# Patient Record
Sex: Female | Born: 1955 | Race: White | Hispanic: No | Smoking: Never smoker
Health system: Southern US, Community
[De-identification: ages and names within clinical notes are randomized; demographics above are authoritative.]

## PROBLEM LIST (undated history)

## (undated) DIAGNOSIS — F419 Anxiety disorder, unspecified: Secondary | ICD-10-CM

## (undated) HISTORY — PX: EYE SURGERY: SHX253

## (undated) HISTORY — PX: FOOT SURGERY: SHX648

## (undated) HISTORY — DX: Anxiety disorder, unspecified: F41.9

---
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
## 1988-05-04 HISTORY — PX: ECTOPIC PREGNANCY SURGERY: SHX613

## 1998-07-04 ENCOUNTER — Other Ambulatory Visit: Admission: RE | Admit: 1998-07-04 | Discharge: 1998-07-04 | Payer: Self-pay | Admitting: Gynecology

## 1998-07-26 ENCOUNTER — Other Ambulatory Visit: Admission: RE | Admit: 1998-07-26 | Discharge: 1998-07-26 | Payer: Self-pay | Admitting: Gynecology

## 1998-10-21 ENCOUNTER — Other Ambulatory Visit: Admission: RE | Admit: 1998-10-21 | Discharge: 1998-10-21 | Payer: Self-pay | Admitting: Gynecology

## 1999-06-16 ENCOUNTER — Other Ambulatory Visit: Admission: RE | Admit: 1999-06-16 | Discharge: 1999-06-16 | Payer: Self-pay | Admitting: Gynecology

## 2000-04-19 ENCOUNTER — Other Ambulatory Visit: Admission: RE | Admit: 2000-04-19 | Discharge: 2000-04-19 | Payer: Self-pay | Admitting: Gynecology

## 2000-12-21 ENCOUNTER — Other Ambulatory Visit: Admission: RE | Admit: 2000-12-21 | Discharge: 2000-12-21 | Payer: Self-pay | Admitting: Gynecology

## 2002-01-25 ENCOUNTER — Other Ambulatory Visit: Admission: RE | Admit: 2002-01-25 | Discharge: 2002-01-25 | Payer: Self-pay | Admitting: Gynecology

## 2003-03-21 ENCOUNTER — Other Ambulatory Visit: Admission: RE | Admit: 2003-03-21 | Discharge: 2003-03-21 | Payer: Self-pay | Admitting: Gynecology

## 2004-04-08 ENCOUNTER — Other Ambulatory Visit: Admission: RE | Admit: 2004-04-08 | Discharge: 2004-04-08 | Payer: Self-pay | Admitting: Gynecology

## 2005-03-17 ENCOUNTER — Encounter: Admission: RE | Admit: 2005-03-17 | Discharge: 2005-03-17 | Payer: Self-pay | Admitting: Otolaryngology

## 2005-05-12 ENCOUNTER — Other Ambulatory Visit: Admission: RE | Admit: 2005-05-12 | Discharge: 2005-05-12 | Payer: Self-pay | Admitting: Gynecology

## 2006-06-07 ENCOUNTER — Other Ambulatory Visit: Admission: RE | Admit: 2006-06-07 | Discharge: 2006-06-07 | Payer: Self-pay | Admitting: Gynecology

## 2011-11-24 ENCOUNTER — Other Ambulatory Visit: Payer: Self-pay | Admitting: Orthopedic Surgery

## 2011-11-26 NOTE — H&P (Signed)
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 She is right-hand dominant.  She presents for evaluation of chronic discomfort in her right hand and a syndrome of awakening in the morning with a clenched fish. She has had active locking of her right long trigger finger and a sense of numbness in her thumb, index, long and ring fingers virtually every morning upon awakening.  Her husband has been a patient of our practice and accompanies her during her consult today.  PAST MEDICAL HISTORY:   Her past medical history is reviewed in detail. She is 5'5" tall and weighs 190 pounds.  She describes her pain as constant, moderately severe, dull, aching and burning in quality, associated with swelling and numbness.  It is gradually getting worse.  She does have sleep impairment.  She has been using ibuprofen for pain.    ALLERGIES:   None. MEDICATIONS:   Elestrin gel applied b.i.d. SURGICAL HISTORY:    Cyst excision from sinus in 2006, Morton's neuroma resection in 2011 left foot. SOCIAL HISTORY:    She is married, she is a nonsmoker, she does not drink alcoholic beverages. FAMILY MEDICAL HISTORY:  Detailed and positive for diabetes affecting her father.  REVIEW OF SYSTEMS:   Corrective lenses.  PHYSICAL EXAMINATION:     She is a well appearing 56 year-old woman.  Inspection of her hands reveals no significant stigmata of osteoarthritis.  She is noted to have full range of motion of her fingers in flexion/extension, but actively locks her right long finger in flexion. She is tender on palpation over the A-1 pulley.  She has positive wrist flexion test at one minute on the right, negative on the left.  She does not show signs of ulnar nerve entrapment.  Her pulse and cap refill are intact.  Motor and sensory examination intact at rest.    RADIOGRAPHS:   Plain x-rays of her hands, AP, lateral and a tangential Karen Singh view of her right thumb demonstrate normal findings for her  age.   Due to her persistent numbness electrodiagnostic studies were completed by Dr. Johna Roles.  These revealed no evidence of a significant right or left median neuropathy.    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    DISCUSSION:   Ms. Hyun appears to have tenosynovitis and stenosing tenosynovitis of her right long finger.  Her symptom pattern is consistent with early carpal tunnel syndrome.  PLAN:  I have advised her to night splint and have provided her with a properly sized splint.    PROCEDURE:   After informed consent and alcohol/Betadine prep she is injected with Depo Medrol and Lidocaine into her right long finger flexor sheath with good sheath distention.  We had a lengthy discussion regarding her symptom pattern. The patient returned 2 times over the next 8 months for additional treatment. She eventually underwent 1 more injection. Despite long term conservative treatment she has persistent symptoms of STS of the right long finger. We feel that she would be best served by undergoing release of the A-1 pulley of the right long finger.The procedure, risks,benefits and post-op course were discussed with the patient at length and they were in agreement with the plan.   Jonni Sanger PA-C  H&P documentation: 11/27/2011  -History and Physical Reviewed  -Patient has been re-examined  -No change in the plan of care  Karen Singh  Christena Flake, MD

## 2011-11-27 ENCOUNTER — Encounter (HOSPITAL_BASED_OUTPATIENT_CLINIC_OR_DEPARTMENT_OTHER)
Admission: RE | Disposition: A | Discharge status: Home / Self Care | Payer: Self-pay | Source: Ambulatory Visit | Attending: Orthopedic Surgery

## 2011-11-27 ENCOUNTER — Encounter (HOSPITAL_BASED_OUTPATIENT_CLINIC_OR_DEPARTMENT_OTHER): Payer: Self-pay | Admitting: *Deleted

## 2011-11-27 ENCOUNTER — Ambulatory Visit (HOSPITAL_BASED_OUTPATIENT_CLINIC_OR_DEPARTMENT_OTHER)
Admission: RE | Admit: 2011-11-27 | Discharge: 2011-11-27 | Disposition: A | Discharge status: Home / Self Care | Payer: 59 | Source: Ambulatory Visit | Attending: Orthopedic Surgery | Admitting: Orthopedic Surgery

## 2011-11-27 DIAGNOSIS — M65849 Other synovitis and tenosynovitis, unspecified hand: Secondary | ICD-10-CM | POA: Insufficient documentation

## 2011-11-27 DIAGNOSIS — M6530 Trigger finger, unspecified finger: Secondary | ICD-10-CM | POA: Insufficient documentation

## 2011-11-27 DIAGNOSIS — M65839 Other synovitis and tenosynovitis, unspecified forearm: Secondary | ICD-10-CM | POA: Insufficient documentation

## 2011-11-27 HISTORY — PX: TRIGGER FINGER RELEASE: SHX641

## 2011-11-27 SURGERY — MINOR RELEASE TRIGGER FINGER/A-1 PULLEY
Anesthesia: LOCAL | Site: Hand | Laterality: Right | Wound class: Clean

## 2011-11-27 MED ORDER — CHLORHEXIDINE GLUCONATE 4 % EX LIQD: 60.0000 mL | Freq: Once | CUTANEOUS | Status: DC

## 2011-11-27 MED ORDER — TRAMADOL HCL 50 MG PO TABS: ORAL_TABLET | ORAL | Status: AC

## 2011-11-27 MED ORDER — LIDOCAINE HCL 2 % IJ SOLN
INTRAMUSCULAR | Status: DC | PRN
  Administered 2011-11-27: 3 mL

## 2011-11-27 SURGICAL SUPPLY — 37 items
BANDAGE ADHESIVE 1X3 (GAUZE/BANDAGES/DRESSINGS) IMPLANT
BLADE SURG 15 STRL LF DISP TIS (BLADE) ×1 IMPLANT
BLADE SURG 15 STRL SS (BLADE) ×2
BNDG CMPR 9X4 STRL LF SNTH (GAUZE/BANDAGES/DRESSINGS) ×1
BNDG CMPR MD 5X2 ELC HKLP STRL (GAUZE/BANDAGES/DRESSINGS) ×1
BNDG ELASTIC 2 VLCR STRL LF (GAUZE/BANDAGES/DRESSINGS) ×2 IMPLANT
BNDG ESMARK 4X9 LF (GAUZE/BANDAGES/DRESSINGS) ×1 IMPLANT
BRUSH SCRUB EZ PLAIN DRY (MISCELLANEOUS) ×2 IMPLANT
CLOTH BEACON ORANGE TIMEOUT ST (SAFETY) ×2 IMPLANT
CORDS BIPOLAR (ELECTRODE) IMPLANT
COVER MAYO STAND STRL (DRAPES) ×2 IMPLANT
COVER TABLE BACK 60X90 (DRAPES) IMPLANT
CUFF TOURNIQUET SINGLE 18IN (TOURNIQUET CUFF) ×1 IMPLANT
DECANTER SPIKE VIAL GLASS SM (MISCELLANEOUS) IMPLANT
DRAPE SURG 17X23 STRL (DRAPES) ×2 IMPLANT
GAUZE SPONGE 4X4 12PLY STRL LF (GAUZE/BANDAGES/DRESSINGS) ×4 IMPLANT
GLOVE BIO SURGEON STRL SZ7 (GLOVE) ×1 IMPLANT
GLOVE BIOGEL M STRL SZ7.5 (GLOVE) ×2 IMPLANT
GLOVE EXAM NITRILE LRG STRL (GLOVE) ×1 IMPLANT
GLOVE ORTHO TXT STRL SZ7.5 (GLOVE) ×2 IMPLANT
GOWN BRE IMP PREV XXLGXLNG (GOWN DISPOSABLE) ×2 IMPLANT
GOWN PREVENTION PLUS XLARGE (GOWN DISPOSABLE) ×2 IMPLANT
NEEDLE 27GAX1X1/2 (NEEDLE) ×2 IMPLANT
PACK BASIN DAY SURGERY FS (CUSTOM PROCEDURE TRAY) IMPLANT
PADDING CAST ABS 4INX4YD NS (CAST SUPPLIES) ×1
PADDING CAST ABS COTTON 4X4 ST (CAST SUPPLIES) ×1 IMPLANT
SPONGE GAUZE 4X4 12PLY (GAUZE/BANDAGES/DRESSINGS) ×1 IMPLANT
STOCKINETTE 4X48 STRL (DRAPES) ×2 IMPLANT
STRIP CLOSURE SKIN 1/2X4 (GAUZE/BANDAGES/DRESSINGS) ×2 IMPLANT
SUT PROLENE 3 0 PS 2 (SUTURE) ×2 IMPLANT
SUT PROLENE 4 0 P 3 18 (SUTURE) IMPLANT
SYR 3ML 23GX1 SAFETY (SYRINGE) IMPLANT
SYR CONTROL 10ML LL (SYRINGE) ×2 IMPLANT
TOWEL OR 17X24 6PK STRL BLUE (TOWEL DISPOSABLE) ×4 IMPLANT
TRAY DSU PREP LF (CUSTOM PROCEDURE TRAY) ×2 IMPLANT
UNDERPAD 30X30 INCONTINENT (UNDERPADS AND DIAPERS) ×2 IMPLANT
WATER STERILE IRR 1000ML POUR (IV SOLUTION) ×1 IMPLANT

## 2011-11-27 NOTE — Brief Op Note (Signed)
11/27/2011  8:15 AM  PATIENT:  Leighton Parody  56 y.o. female  PRE-OPERATIVE DIAGNOSIS:  right long finger chronic triggering  POST-OPERATIVE DIAGNOSIS:  Right long trigger fiinger  PROCEDURE:  Procedure(s) (LRB): MINOR RELEASE TRIGGER FINGER/A-1 PULLEY (Right) long finger  SURGEON:  Surgeon(s) and Role:    * Wyn Forster., MD - Primary  PHYSICIAN ASSISTANT:   ASSISTANTS: Mallory Shirk.A-C   ANESTHESIA:   local  EBL:     BLOOD ADMINISTERED:none  DRAINS: none   LOCAL MEDICATIONS USED:  XYLOCAINE   SPECIMEN:  No Specimen  DISPOSITION OF SPECIMEN:  N/A  COUNTS:  YES  TOURNIQUET:   Total Tourniquet Time Documented: Upper Arm (Right) - 6 minutes  DICTATION: .Other Dictation: Dictation Number [id number]  PLAN OF CARE: Discharge to home after PACU  PATIENT DISPOSITION:  PACU - hemodynamically stable.   }

## 2011-11-30 ENCOUNTER — Encounter (HOSPITAL_BASED_OUTPATIENT_CLINIC_OR_DEPARTMENT_OTHER): Payer: Self-pay | Admitting: Orthopedic Surgery

## 2011-11-30 ENCOUNTER — Encounter (HOSPITAL_BASED_OUTPATIENT_CLINIC_OR_DEPARTMENT_OTHER): Payer: Self-pay

## 2011-11-30 NOTE — Op Note (Signed)
[name], [name]                  ACCOUNT NO.:  [id number]  MEDICAL RECORD NO.:  [id number]  LOCATION:                               FACILITY:  [facility]  PHYSICIAN:  Katy Fitch. Zacharey Jensen, M.D.      DATE OF BIRTH:  DATE OF PROCEDURE:  11/27/2011 DATE OF DISCHARGE:  11/27/2011                              OPERATIVE REPORT   PREOPERATIVE DIAGNOSIS:  Chronic stenosing tenosynovitis of right long finger with 10-degree flexion contracture of proximal interphalangeal joint.  POSTOPERATIVE DIAGNOSIS:  Chronic stenosing tenosynovitis of right long finger with 10-degree flexion contracture of proximal interphalangeal joint.  OPERATION:  Release of right long finger A1 pulley under local anesthesia.  OPERATING SURGEON:  Katy Fitch. Seydou Hearns, M.D.  ASSISTANT:  Marveen Reeks. Dasnoit, PA-C  ANESTHESIA:  2% lidocaine palmar block and flexor sheath block, right long finger.  This was performed as a minor operating room procedure.  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 Gennaro Lizotte Bellow at the urgent medical care center for evaluation and management of a locking trigger finger.  She has had triggering for months.  She had 10-degree flexion contracture of PIP joint and swelling.  She appeared to be developing some early Dupuytren's palmar fibromatosis as well.  Due to her failure to respond to nonoperative measures, she is brought to the operating room at this time for release of her right long finger A1 pulley.  PROCEDURE:  Karen Singh was interviewed in the holding area and her proper surgical site identified per protocol.  She was transferred to room 1 of the [facility] and placed in supine position upon the operating table.  There after informed consent and alcohol Betadine prep, 2% lidocaine was infiltrated in the path of the intended incision and long finger flexor sheath.  After waiting 10 minutes, she had satisfactory anesthesia in the  finger and palm.  The right hand and arm were prepped with Betadine soap and solution, sterilely draped.  A pneumatic tourniquet was applied to proximal right brachium.  Upon exsanguination of the right arm with Esmarch bandage, arterial tourniquet was inflated to 220 mmHg.  Procedure commenced with routine surgical time-out.  A short 1 cm incision was fashioned at the distal palmar crease.  Subcutaneous tissues were carefully divided revealing early Dupuytren's fibromatosis.  The pretendinous fibers to the long finger released with scissors as well as some of the septae. The A1 pulley was isolated and found to be moderately edematous.  The pulley was then split with scalpel scissors.  The tendon was delivered and found to be invested in a fibrotic cuff of tenosynovium.  This was removed with scissors and micro rongeur dissection.  Thereafter, Karen Singh demonstrated full active range of motion of her finger.  With the MP joint flexed 70 degrees, I could fully extend the PIP joint.  The wound was then repaired with intradermal 3-0 Prolene suture.  A compressive dressing was applied with Steri-Strips, sterile gauze, and Ace wrap.  Karen Singh was advised to begin range of motion exercises immediately. She may remove her large dressing in 3 days and begin using Band-Aids. We will  see her back for followup in our office in 1 week for suture removal.     Katy Fitch. Graylin Sperling, M.D.     RVS/MEDQ  D:  11/27/2011  T:  11/28/2011  Job:  [id number]  cc:   Jonita Albee, M.D.

## 2012-12-15 ENCOUNTER — Emergency Department (HOSPITAL_COMMUNITY)
Admission: EM | Admit: 2012-12-15 | Discharge: 2012-12-15 | Disposition: A | Discharge status: Other Acute Inpatient Hospital | Payer: 59 | Source: Home / Self Care

## 2012-12-15 ENCOUNTER — Encounter (HOSPITAL_COMMUNITY): Payer: Self-pay | Admitting: *Deleted

## 2012-12-15 DIAGNOSIS — R51 Headache: Secondary | ICD-10-CM

## 2012-12-15 DIAGNOSIS — Z79899 Other long term (current) drug therapy: Secondary | ICD-10-CM | POA: Insufficient documentation

## 2012-12-15 DIAGNOSIS — H53149 Visual discomfort, unspecified: Secondary | ICD-10-CM | POA: Insufficient documentation

## 2012-12-15 LAB — BASIC METABOLIC PANEL
BUN: 13 mg/dL (ref 6–23)
CO2: 24 mEq/L (ref 19–32)
Calcium: 9.6 mg/dL (ref 8.4–10.5)
Creatinine, Ser: 0.54 mg/dL (ref 0.50–1.10)
GFR calc Af Amer: >90 mL/min (ref >90)
GFR calc non Af Amer: >90 mL/min (ref >90)
Glucose, Bld: 114 mg/dL — ABNORMAL HIGH (ref 70–99)
Potassium: 3.9 mEq/L (ref 3.5–5.1)

## 2012-12-15 NOTE — ED Notes (Signed)
Headache since this am with n v no diarrhea

## 2012-12-15 NOTE — ED Notes (Signed)
Pt   Reports  Symptoms  Of  Headache        Dizzy     Vomiting  And  Photophobia   Which  Started  This am   Family   Member  Reports   thought  Processes  Seemed    A  Little  Slow  This  Am      At  This  Time  Pt is  Awake   Alert   Somewhat  Sheepish      No  Active    Vomiting  At this  Time

## 2012-12-15 NOTE — ED Provider Notes (Signed)
  CSN: [id number]     Arrival date & time 12/15/12  1746 History     First MD Initiated Contact with Patient 12/15/12 1826     Chief Complaint  Patient presents with  . Headache   (Consider location/radiation/quality/duration/timing/severity/associated sxs/prior Treatment) HPI  57 yo wf comes in with complaint of a severe headache that started earlier today.  States that this is the worst headache that she has ever had.  No hx of migraine or frequent headaches.  Admits photophobia, nausea, vomiting.  Unable to keep anything down.  At onset she felt like she was going to pass out.  Family member reported her seeming a "little slow" this morning.  No fever.    History reviewed. No pertinent past medical history. Past Surgical History  Procedure Laterality Date  . Trigger finger release  11/27/2011    Procedure: MINOR RELEASE TRIGGER FINGER/A-1 PULLEY;  Surgeon: Wyn Forster., MD;  Location: [facility];  Service: Orthopedics;  Laterality: Right;  right long   No family history on file. History  Substance Use Topics  . Smoking status: Not on file  . Smokeless tobacco: Not on file  . Alcohol Use: No   OB History   Grav Para Term Preterm Abortions TAB SAB Ect Mult Living                 Review of Systems  Constitutional: Positive for activity change. Negative for fever.  Eyes: Positive for photophobia. Negative for pain, discharge and itching.  Respiratory: Negative.   Cardiovascular: Negative.   Gastrointestinal: Positive for nausea and vomiting.  Genitourinary: Negative.   Musculoskeletal: Negative.   Skin: Negative.   Neurological: Positive for headaches. Negative for seizures, syncope, speech difficulty, weakness and numbness.  Psychiatric/Behavioral: Negative.     Allergies  Review of patient's allergies indicates no known allergies.  Home Medications   Current Outpatient Rx  Name  Route  Sig  Dispense  Refill  . ALPRAZOLAM PO   Oral   Take by  mouth.         . Estradiol (ESTRACE PO)   Oral   Take by mouth.          BP 149/81  Pulse 69  Temp(Src) 97.6 F (36.4 C) (Oral)  Resp 16  SpO2 98% Physical Exam  Constitutional: She is oriented to person, place, and time. She appears well-developed and well-nourished. She appears distressed.  HENT:  Head: Normocephalic and atraumatic.  Eyes: EOM are normal. Pupils are equal, round, and reactive to light.  Neck: Normal range of motion.  Cardiovascular: Normal rate and regular rhythm.   Pulmonary/Chest: Effort normal and breath sounds normal.  Musculoskeletal: Normal range of motion.  Neurological: She is alert and oriented to person, place, and time.  Skin: Skin is warm and dry.  Psychiatric: She has a normal mood and affect.    ED Course   Procedures (including critical care time)  Labs Reviewed  BASIC METABOLIC PANEL - Abnormal; Notable for the following:    Glucose, Bld 114 (*)    All other components within normal limits   No results found. 1. Headache     MDM  Will send patient down to ED for further evaluation due to the severity of her complaint.  Voices understanding.  Advised that ED will decide if CT scan is indicated.    Zonia Kief, PA-C 12/15/12 2002

## 2012-12-15 NOTE — ED Notes (Signed)
NURSE FIRST ROUNDS : NURSE EXPLAINED DELAY , WAIT TIME AND PROCESS TO PT. , RESPIRATIONS UNLABORED / DENIES PAIN AT THIS TIME .

## 2012-12-16 ENCOUNTER — Emergency Department (HOSPITAL_COMMUNITY)
Admission: EM | Admit: 2012-12-16 | Discharge: 2012-12-16 | Disposition: A | Discharge status: Home / Self Care | Payer: 59 | Attending: Emergency Medicine | Admitting: Emergency Medicine

## 2012-12-16 ENCOUNTER — Emergency Department (HOSPITAL_COMMUNITY): Payer: 59

## 2012-12-16 MED ORDER — METOCLOPRAMIDE HCL 5 MG/ML IJ SOLN
10.0000 mg | Freq: Once | INTRAMUSCULAR | Status: AC
  Administered 2012-12-16: 10 mg via INTRAVENOUS
  Filled 2012-12-16: qty 2

## 2012-12-16 MED ORDER — SODIUM CHLORIDE 0.9 % IV BOLUS (SEPSIS)
1000.0000 mL | Freq: Once | INTRAVENOUS | Status: AC
  Administered 2012-12-16: 1000 mL via INTRAVENOUS

## 2012-12-16 MED ORDER — DIPHENHYDRAMINE HCL 50 MG/ML IJ SOLN
25.0000 mg | Freq: Once | INTRAMUSCULAR | Status: AC
  Administered 2012-12-16: 25 mg via INTRAVENOUS
  Filled 2012-12-16: qty 1

## 2012-12-16 MED ORDER — KETOROLAC TROMETHAMINE 30 MG/ML IJ SOLN
30.0000 mg | Freq: Once | INTRAMUSCULAR | Status: AC
  Administered 2012-12-16: 30 mg via INTRAVENOUS
  Filled 2012-12-16: qty 1

## 2012-12-16 MED ORDER — LORAZEPAM 2 MG/ML IJ SOLN
1.0000 mg | Freq: Once | INTRAMUSCULAR | Status: AC
  Administered 2012-12-16: 1 mg via INTRAVENOUS
  Filled 2012-12-16: qty 1

## 2012-12-16 NOTE — ED Provider Notes (Signed)
CSN: [id number]     Arrival date & time 12/15/12  2034 History     First MD Initiated Contact with Patient 12/16/12 0156     Chief Complaint  Patient presents with  . Headache   (Consider location/radiation/quality/duration/timing/severity/associated sxs/prior Treatment) HPI 57 yo female presents from urgent care with complaint of headache.  HA started this morning, noted it when she woke.  Headache is global.  She has had n/v with the headache, mild photophobia.  Pt does not normally have headaches, no prior h/o migraines.  No fevers, no neck stiffness.  HA has been severe, but not acute in onset of headache, not thunderclap.  No tick bites.  Pt was seen at urgent care and referred on due to severe headache.  Per their history, family thought pt was a bit slow this am.  Pt reports her headache has actually improved during her wait to be seen.  No further confusion.  History reviewed. No pertinent past medical history. Past Surgical History  Procedure Laterality Date  . Trigger finger release  11/27/2011    Procedure: MINOR RELEASE TRIGGER FINGER/A-1 PULLEY;  Surgeon: Wyn Forster., MD;  Location: [facility];  Service: Orthopedics;  Laterality: Right;  right long   No family history on file. History  Substance Use Topics  . Smoking status: Never Smoker   . Smokeless tobacco: Not on file  . Alcohol Use: No   OB History   Grav Para Term Preterm Abortions TAB SAB Ect Mult Living                 Review of Systems  All other systems reviewed and are negative.  other than listed in HPI   Allergies  Review of patient's allergies indicates no known allergies.  Home Medications   Current Outpatient Rx  Name  Route  Sig  Dispense  Refill  . ALPRAZolam (XANAX) 0.5 MG tablet   Oral   Take 0.5 mg by mouth daily as needed for sleep or anxiety.         Marland Kitchen estradiol (ESTRACE) 0.1 MG/GM vaginal cream   Vaginal   Place 2 g vaginally See admin instructions. Apply  one gram intravaginally 2-3 time per week as directed         . estradiol (VIVELLE-DOT) 0.05 MG/24HR patch   Transdermal   Place 1 patch onto the skin once a week.         Marland Kitchen ibuprofen (ADVIL,MOTRIN) 200 MG tablet   Oral   Take 400 mg by mouth every 6 (six) hours as needed for pain.          BP 143/72  Pulse 73  Temp(Src) 98.5 F (36.9 C) (Oral)  Resp 14  SpO2 100% Physical Exam  Nursing note and vitals reviewed. Constitutional: She is oriented to person, place, and time. She appears well-developed and well-nourished. She appears distressed (uncomfortable appearing).  HENT:  Head: Normocephalic and atraumatic.  Right Ear: External ear normal.  Left Ear: External ear normal.  Nose: Nose normal.  Mouth/Throat: Oropharynx is clear and moist.  Eyes: Conjunctivae and EOM are normal. Pupils are equal, round, and reactive to light.  Neck: Normal range of motion. Neck supple. No JVD present. No tracheal deviation present. No thyromegaly present.  Cardiovascular: Normal rate, regular rhythm, normal heart sounds and intact distal pulses.  Exam reveals no gallop and no friction rub.   No murmur heard. Pulmonary/Chest: Effort normal and breath sounds normal. No stridor. No  respiratory distress. She has no wheezes. She has no rales. She exhibits no tenderness.  Abdominal: Soft. Bowel sounds are normal. She exhibits no distension and no mass. There is no tenderness. There is no rebound and no guarding.  Musculoskeletal: Normal range of motion. She exhibits no edema and no tenderness.  Lymphadenopathy:    She has no cervical adenopathy.  Neurological: She is alert and oriented to person, place, and time. She has normal reflexes. No cranial nerve deficit. She exhibits normal muscle tone. Coordination normal.  Skin: Skin is warm and dry. No rash noted. No erythema. No pallor.  Psychiatric: She has a normal mood and affect. Her behavior is normal. Judgment and thought content normal.    ED  Course   Procedures (including critical care time)  Labs Reviewed - No data to display Ct Head Wo Contrast  12/16/2012   *RADIOLOGY REPORT*  Clinical Data: Headache.  CT HEAD WITHOUT CONTRAST  Technique:  Contiguous axial images were obtained from the base of the skull through the vertex without contrast.  Comparison: No priors.  Findings: No acute intracranial abnormalities.  Specifically, no evidence of acute intracranial hemorrhage, no definite findings of acute/subacute cerebral ischemia, no mass, mass effect, hydrocephalus or abnormal intra or extra-axial fluid collections. Visualized paranasal sinuses and mastoids are well pneumatized.  No acute displaced skull fractures are identified.  IMPRESSION: 1.  No acute intracranial abnormalities. 2.  The appearance of the brain is normal.   Original Report Authenticated By: Trudie Reed, M.D.   1. Headache     MDM  57 yo female with headache, initially severe, but now improved.  Exam nonfocal, sxs to do not seem c/w SAH.  Ct scan negative.  Pain improved with headache cocktail.  Will have pt f/u with her pcm.  Olivia Mackie, MD 12/16/12 [id number]

## 2012-12-16 NOTE — ED Provider Notes (Signed)
Medical screening examination/treatment/procedure(s) were performed by a resident physician or non-physician practitioner and as the supervising physician I was immediately available for consultation/collaboration.  Kimila Papaleo, MD   Nashla Althoff S Janya Eveland, MD 12/16/12 0759 

## 2014-01-02 LAB — HM MAMMOGRAPHY

## 2014-02-14 ENCOUNTER — Encounter: Payer: Self-pay | Admitting: Family Medicine

## 2014-02-14 ENCOUNTER — Ambulatory Visit (INDEPENDENT_AMBULATORY_CARE_PROVIDER_SITE_OTHER): Payer: 59 | Admitting: Family Medicine

## 2014-02-14 ENCOUNTER — Encounter (INDEPENDENT_AMBULATORY_CARE_PROVIDER_SITE_OTHER): Payer: Self-pay

## 2014-02-14 VITALS — BP 120/68 | HR 67 | Temp 98.0°F | Ht 65.75 in | Wt 221.5 lb

## 2014-02-14 DIAGNOSIS — Z23 Encounter for immunization: Secondary | ICD-10-CM

## 2014-02-14 DIAGNOSIS — Z13220 Encounter for screening for lipoid disorders: Secondary | ICD-10-CM

## 2014-02-14 DIAGNOSIS — R011 Cardiac murmur, unspecified: Secondary | ICD-10-CM | POA: Insufficient documentation

## 2014-02-14 DIAGNOSIS — Z79890 Hormone replacement therapy (postmenopausal): Secondary | ICD-10-CM | POA: Insufficient documentation

## 2014-02-14 DIAGNOSIS — R7989 Other specified abnormal findings of blood chemistry: Secondary | ICD-10-CM | POA: Insufficient documentation

## 2014-02-14 DIAGNOSIS — F4321 Adjustment disorder with depressed mood: Secondary | ICD-10-CM | POA: Insufficient documentation

## 2014-02-14 DIAGNOSIS — Z8349 Family history of other endocrine, nutritional and metabolic diseases: Secondary | ICD-10-CM | POA: Insufficient documentation

## 2014-02-14 DIAGNOSIS — R946 Abnormal results of thyroid function studies: Secondary | ICD-10-CM

## 2014-02-14 LAB — CBC WITH DIFFERENTIAL/PLATELET
BASOS PCT: 0.5 % (ref 0.0–3.0)
Basophils Absolute: 0 10*3/uL (ref 0.0–0.1)
Eosinophils Absolute: 0.2 10*3/uL (ref 0.0–0.7)
Eosinophils Relative: 2.7 % (ref 0.0–5.0)
HEMATOCRIT: 40.2 % (ref 36.0–46.0)
Hemoglobin: 13.4 g/dL (ref 12.0–15.0)
Lymphocytes Relative: 26.2 % (ref 12.0–46.0)
Lymphs Abs: 1.6 10*3/uL (ref 0.7–4.0)
MCHC: 33.3 g/dL (ref 30.0–36.0)
MCV: 87.6 fl (ref 78.0–100.0)
MONO ABS: 0.5 10*3/uL (ref 0.1–1.0)
Monocytes Relative: 8.2 % (ref 3.0–12.0)
NEUTROS PCT: 62.4 % (ref 43.0–77.0)
Neutro Abs: 3.8 10*3/uL (ref 1.4–7.7)
Platelets: 246 10*3/uL (ref 150.0–400.0)
RBC: 4.59 Mil/uL (ref 3.87–5.11)
RDW: 13.5 % (ref 11.5–15.5)
WBC: 6.1 10*3/uL (ref 4.0–10.5)

## 2014-02-14 LAB — COMPREHENSIVE METABOLIC PANEL
ALT: 28 U/L (ref 0–35)
AST: 36 U/L (ref 0–37)
Albumin: 3.8 g/dL (ref 3.5–5.2)
Alkaline Phosphatase: 65 U/L (ref 39–117)
BUN: 10 mg/dL (ref 6–23)
CO2: 27 mEq/L (ref 19–32)
Calcium: 9.1 mg/dL (ref 8.4–10.5)
Chloride: 107 mEq/L (ref 96–112)
Creatinine, Ser: 0.9 mg/dL (ref 0.4–1.2)
GFR: 72.78 mL/min (ref >60.00)
Glucose, Bld: 92 mg/dL (ref 70–99)
POTASSIUM: 4.2 meq/L (ref 3.5–5.1)
Sodium: 139 mEq/L (ref 135–145)
Total Bilirubin: 0.6 mg/dL (ref 0.2–1.2)
Total Protein: 7.3 g/dL (ref 6.0–8.3)

## 2014-02-14 LAB — LIPID PANEL
CHOLESTEROL: 227 mg/dL — AB (ref 0–200)
HDL: 39.5 mg/dL (ref >39.00)
LDL Cholesterol: 150 mg/dL — ABNORMAL HIGH (ref 0–99)
NonHDL: 187.5
TRIGLYCERIDES: 188 mg/dL — AB (ref 0.0–149.0)
Total CHOL/HDL Ratio: 6
VLDL: 37.6 mg/dL (ref 0.0–40.0)

## 2014-02-14 LAB — T4, FREE: Free T4: 0.8 ng/dL (ref 0.60–1.60)

## 2014-02-14 LAB — TSH: TSH: 1.94 u[IU]/mL (ref 0.35–4.50)

## 2014-02-14 NOTE — Assessment & Plan Note (Signed)
Managed by GYN. I did suggest she talk with her about weaning off of this. The patient indicates understanding of these issues and agrees with the plan.

## 2014-02-14 NOTE — Assessment & Plan Note (Signed)
New- asymptotic. Will order 2 Decho for further evaluation. The patient indicates understanding of these issues and agrees with the plan.

## 2014-02-14 NOTE — Assessment & Plan Note (Signed)
New- with pos family history. Will check labs today.

## 2014-02-14 NOTE — Progress Notes (Signed)
Subjective:   Patient ID: Karen Singh, female    DOB: 1955/08/03, 58 y.o.   MRN: [id number]  HAYDN CUSH is a pleasant 58 y.o. year old female who presents to clinic today with [place]  on 02/14/2014  HPI:  Has not had a primary doctor in years. Sees Paula Compton, OBGYN.  Last saw her in 04/2013.   Mammogram UTD- 01/2014. Colonoscopy (Dr. Janese Banks)- 2010, per pt- 10 year recall.  ?hypothyroidism- was told by Dr. Marvel Plan that her "thyroid was a little off."  She did not recommend tx. She has a strong family h/o hypothyroidism- both sisters on thyroid replacement. She denies any symptoms of hypo or hyperthyroidism.  Recently widowed- 03/16/13. Husband died of pancreatic cancer. Her mom died of multiple myeloma in Aug 14, 2013, she was 59 yo.  She feels she coping ok with all of this loss. Has a good support system.  She is also close to her son.  Hot flashes- she is on HRT- managed by GYN. Current Outpatient Prescriptions on File Prior to Visit  Medication Sig Dispense Refill  . ALPRAZolam (XANAX) 0.5 MG tablet Take 0.5 mg by mouth daily as needed for sleep or anxiety.      Marland Kitchen estradiol (ESTRACE) 0.1 MG/GM vaginal cream Place 2 g vaginally See admin instructions. Apply one gram intravaginally 2-3 time per week as directed      . estradiol (VIVELLE-DOT) 0.05 MG/24HR patch Place 1 patch onto the skin once a week.      Marland Kitchen ibuprofen (ADVIL,MOTRIN) 200 MG tablet Take 400 mg by mouth every 6 (six) hours as needed for pain.       No current facility-administered medications on file prior to visit.    No Known Allergies  Past Medical History  Diagnosis Date  . Anxiety     Past Surgical History  Procedure Laterality Date  . Trigger finger release  11/27/2011    Procedure: MINOR RELEASE TRIGGER FINGER/A-1 PULLEY;  Surgeon: Cammie Sickle., MD;  Location: [place];  Service: Orthopedics;  Laterality: Right;  right long  . Tonsillectomy and adenoidectomy  1975    . Ectopic pregnancy surgery  1990  . Foot surgery      Family History  Problem Relation Age of Onset  . Cancer Mother   . Hyperlipidemia Mother   . Cancer Father   . Diabetes Father   . Cancer Sister     History   Social History  . Marital Status: Widowed    Spouse Name: N/A    Number of Children: N/A  . Years of Education: N/A   Occupational History  . Not on file.   Social History Main Topics  . Smoking status: Never Smoker   . Smokeless tobacco: Never Used  . Alcohol Use: No  . Drug Use: No  . Sexual Activity: No     Comment: widow   Other Topics Concern  . Not on file   Social History Narrative  . No narrative on file   The PMH, PSH, Social History, Family History, Medications, and allergies have been reviewed in [facility], and have been updated if relevant.    Review of Systems  Constitutional: Negative for fever and fatigue.  Eyes: Negative.   Respiratory: Negative.   Cardiovascular: Negative.   Gastrointestinal: Negative.   Endocrine: Negative.   Genitourinary: Negative.   Musculoskeletal: Negative.   Neurological: Negative.   Hematological: Negative.   Psychiatric/Behavioral: Negative.   All other systems reviewed  and are negative.      Objective:    BP 120/68  Pulse 67  Temp(Src) 98 F (36.7 C) (Oral)  Ht 5' 5.75" (1.67 m)  Wt 221 lb 8 oz (100.472 kg)  BMI 36.03 kg/m2  SpO2 97%   Physical Exam  Nursing note and vitals reviewed. Constitutional: She is oriented to person, place, and time. She appears well-developed and well-nourished. No distress.  HENT:  Head: Normocephalic.  Eyes: Pupils are equal, round, and reactive to light.  Neck: Normal range of motion. Neck supple. No thyromegaly present.  Cardiovascular: Normal rate and regular rhythm.   Murmur heard. Abdominal: Soft. Bowel sounds are normal.  Musculoskeletal: Normal range of motion.  Neurological: She is alert and oriented to person, place, and time.  Skin: Skin is warm  and dry.  Psychiatric: She has a normal mood and affect. Her behavior is normal. Judgment and thought content normal.    BP 120/68  Pulse 67  Temp(Src) 98 F (36.7 C) (Oral)  Ht 5' 5.75" (1.67 m)  Wt 221 lb 8 oz (100.472 kg)  BMI 36.03 kg/m2  SpO2 97%      Assessment & Plan:   Need for influenza vaccination - Plan: Flu Vaccine QUAD 36+ mos PF IM (Fluarix Quad PF) No Follow-up on file.

## 2014-02-14 NOTE — Assessment & Plan Note (Signed)
Appropriate. Offered support. She will keep me updated.

## 2014-02-14 NOTE — Patient Instructions (Signed)
It was nice to meet you. We will call you with your lab results from today. You can view them online with mychart.  Please stop by to see [name] on your way out to setup the ultrasound of your heart.

## 2014-02-14 NOTE — Progress Notes (Signed)
Pre visit review using our clinic review tool, if applicable. No additional management support is needed unless otherwise documented below in the visit note. 

## 2014-02-15 ENCOUNTER — Encounter: Payer: Self-pay | Admitting: *Deleted

## 2014-02-16 ENCOUNTER — Ambulatory Visit (HOSPITAL_COMMUNITY): Payer: 59 | Attending: Family Medicine | Admitting: Radiology

## 2014-02-16 ENCOUNTER — Other Ambulatory Visit (HOSPITAL_COMMUNITY): Payer: Self-pay | Admitting: Family Medicine

## 2014-02-16 DIAGNOSIS — R011 Cardiac murmur, unspecified: Secondary | ICD-10-CM

## 2014-02-16 NOTE — Progress Notes (Signed)
Echocardiogram performed.  

## 2014-09-13 ENCOUNTER — Encounter: Payer: Self-pay | Admitting: Primary Care

## 2014-09-13 ENCOUNTER — Ambulatory Visit (INDEPENDENT_AMBULATORY_CARE_PROVIDER_SITE_OTHER): Payer: 59 | Admitting: Primary Care

## 2014-09-13 VITALS — BP 132/80 | HR 93 | Temp 98.6°F | Ht 65.75 in | Wt 225.4 lb

## 2014-09-13 DIAGNOSIS — R059 Cough, unspecified: Secondary | ICD-10-CM

## 2014-09-13 DIAGNOSIS — R05 Cough: Secondary | ICD-10-CM

## 2014-09-13 MED ORDER — BENZONATATE 200 MG PO CAPS: 200.0000 mg | ORAL_CAPSULE | Freq: Three times a day (TID) | ORAL | Status: DC | PRN

## 2014-09-13 MED ORDER — HYDROCODONE-HOMATROPINE 5-1.5 MG/5ML PO SYRP: 5.0000 mL | ORAL_SOLUTION | Freq: Every evening | ORAL | Status: DC | PRN

## 2014-09-13 MED ORDER — FLUTICASONE PROPIONATE 50 MCG/ACT NA SUSP: 2.0000 | Freq: Every day | NASAL | Status: DC

## 2014-09-13 NOTE — Progress Notes (Signed)
Subjective:    Patient ID: Karen Singh, female    DOB: 11/28/1955, 59 y.o.   MRN: [id number]  HPI  Karen Singh is a 59 year old female who presents today with a chief complaint of cough. Her symptoms began with sore throat, chills, and ear pain 6 days ago. The cough began Monday with worsening cough over the week. She had one low grade fever last weekend, no fevers this week. She's been taking cold and flu, sudafed, and Mucinex without much relief. She denies nausea and vomiting.   Review of Systems  Constitutional: Positive for fever and chills.  HENT: Positive for congestion, ear pain, postnasal drip and sinus pressure. Negative for sore throat.   Respiratory: Positive for cough. Negative for shortness of breath.   Cardiovascular: Negative for chest pain.  Gastrointestinal: Negative for nausea and vomiting.  Musculoskeletal: Negative for myalgias.  Neurological: Positive for headaches. Negative for dizziness.       Past Medical History  Diagnosis Date  . Anxiety     History   Social History  . Marital Status: Widowed    Spouse Name: N/A  . Number of Children: N/A  . Years of Education: N/A   Occupational History  . Not on file.   Social History Main Topics  . Smoking status: Never Smoker   . Smokeless tobacco: Never Used  . Alcohol Use: No  . Drug Use: No  . Sexual Activity: No     Comment: widow   Other Topics Concern  . Not on file   Social History Narrative    Past Surgical History  Procedure Laterality Date  . Trigger finger release  11/27/2011    Procedure: MINOR RELEASE TRIGGER FINGER/A-1 PULLEY;  Surgeon: Wyn Forsterobert V Sypher Jr., MD;  Location: [facility];  Service: Orthopedics;  Laterality: Right;  right long  . Tonsillectomy and adenoidectomy  1975  . Ectopic pregnancy surgery  1990  . Foot surgery      Family History  Problem Relation Age of Onset  . Cancer Mother   . Hyperlipidemia Mother   . Cancer Father   . Diabetes Father   .  Cancer Sister     No Known Allergies  Current Outpatient Prescriptions on File Prior to Visit  Medication Sig Dispense Refill  . ALPRAZolam (XANAX) 0.5 MG tablet Take 0.5 mg by mouth daily as needed for sleep or anxiety.    Marland Kitchen. estradiol (ESTRACE) 0.1 MG/GM vaginal cream Place 2 g vaginally See admin instructions. Apply one gram intravaginally 2-3 time per week as directed    . estradiol (VIVELLE-DOT) 0.05 MG/24HR patch Place 1 patch onto the skin once a week.    Marland Kitchen. ibuprofen (ADVIL,MOTRIN) 200 MG tablet Take 400 mg by mouth every 6 (six) hours as needed for pain.    . progesterone (PROMETRIUM) 200 MG capsule Take 200 mg by mouth. Take 1 tab the first ten days of every other month     No current facility-administered medications on file prior to visit.    BP 132/80 mmHg  Pulse 93  Temp(Src) 98.6 F (37 C) (Oral)  Ht 5' 5.75" (1.67 m)  Wt 225 lb 6.4 oz (102.241 kg)  BMI 36.66 kg/m2  SpO2 97%    Objective:   Physical Exam  Constitutional: She is oriented to person, place, and time. She does not appear ill.  HENT:  Right Ear: Tympanic membrane and ear canal normal.  Left Ear: Tympanic membrane and ear canal normal.  Nose: Nose normal.  Mouth/Throat: Oropharynx is clear and moist.  Eyes: Conjunctivae are normal. Pupils are equal, round, and reactive to light.  Neck: Neck supple.  Cardiovascular: Normal rate and regular rhythm.   Pulmonary/Chest: Effort normal and breath sounds normal.  Lymphadenopathy:    She has no cervical adenopathy.  Neurological: She is alert and oriented to person, place, and time.  Skin: Skin is warm and dry.          Assessment & Plan:  Upper respiratory tract infection:  Suspect viral involvement due to symptoms, appearance, and exam. Supportive measures provided: Tessalon pearls for day cough, Hycodan for cough and sleep, Flonase, Claritin/Zyrtec daily for next several weeks. Push fluids, rest. Call if no improvement by Monday.

## 2014-09-13 NOTE — Progress Notes (Signed)
Pre visit review using our clinic review tool, if applicable. No additional management support is needed unless otherwise documented below in the visit note. 

## 2014-09-13 NOTE — Patient Instructions (Signed)
You may take Tessalon pearls three times daily as needed for daytime cough. You may take the Hycodan at bedtime as needed for cough and sleep. Try taking a daily Claritin or Zyrtec for the next several weeks to help with symptoms.  Use the Flonase nasal spray daily for nasal congestion. Stop taking the sudafed and cold and flu medication. You may use the Mucinex as needed. Call me if no improvement by Monday next week. It was nice meeting you!  Upper Respiratory Infection, Adult An upper respiratory infection (URI) is also sometimes known as the common cold. The upper respiratory tract includes the nose, sinuses, throat, trachea, and bronchi. Bronchi are the airways leading to the lungs. Most people improve within 1 week, but symptoms can last up to 2 weeks. A residual cough may last even longer.  CAUSES Many different viruses can infect the tissues lining the upper respiratory tract. The tissues become irritated and inflamed and often become very moist. Mucus production is also common. A cold is contagious. You can easily spread the virus to others by oral contact. This includes kissing, sharing a glass, coughing, or sneezing. Touching your mouth or nose and then touching a surface, which is then touched by another person, can also spread the virus. SYMPTOMS  Symptoms typically develop 1 to 3 days after you come in contact with a cold virus. Symptoms vary from person to person. They may include:  Runny nose.  Sneezing.  Nasal congestion.  Sinus irritation.  Sore throat.  Loss of voice (laryngitis).  Cough.  Fatigue.  Muscle aches.  Loss of appetite.  Headache.  Low-grade fever. DIAGNOSIS  You might diagnose your own cold based on familiar symptoms, since most people get a cold 2 to 3 times a year. Your caregiver can confirm this based on your exam. Most importantly, your caregiver can check that your symptoms are not due to another disease such as strep throat, sinusitis,  pneumonia, asthma, or epiglottitis. Blood tests, throat tests, and X-rays are not necessary to diagnose a common cold, but they may sometimes be helpful in excluding other more serious diseases. Your caregiver will decide if any further tests are required. RISKS AND COMPLICATIONS  You may be at risk for a more severe case of the common cold if you smoke cigarettes, have chronic heart disease (such as heart failure) or lung disease (such as asthma), or if you have a weakened immune system. The very young and very old are also at risk for more serious infections. Bacterial sinusitis, middle ear infections, and bacterial pneumonia can complicate the common cold. The common cold can worsen asthma and chronic obstructive pulmonary disease (COPD). Sometimes, these complications can require emergency medical care and may be life-threatening. PREVENTION  The best way to protect against getting a cold is to practice good hygiene. Avoid oral or hand contact with people with cold symptoms. Wash your hands often if contact occurs. There is no clear evidence that vitamin C, vitamin E, echinacea, or exercise reduces the chance of developing a cold. However, it is always recommended to get plenty of rest and practice good nutrition. TREATMENT  Treatment is directed at relieving symptoms. There is no cure. Antibiotics are not effective, because the infection is caused by a virus, not by bacteria. Treatment may include:  Increased fluid intake. Sports drinks offer valuable electrolytes, sugars, and fluids.  Breathing heated mist or steam (vaporizer or shower).  Eating chicken soup or other clear broths, and maintaining good nutrition.  Getting  plenty of rest.  Using gargles or lozenges for comfort.  Controlling fevers with ibuprofen or acetaminophen as directed by your caregiver.  Increasing usage of your inhaler if you have asthma. Zinc gel and zinc lozenges, taken in the first 24 hours of the common cold, can  shorten the duration and lessen the severity of symptoms. Pain medicines may help with fever, muscle aches, and throat pain. A variety of non-prescription medicines are available to treat congestion and runny nose. Your caregiver can make recommendations and may suggest nasal or lung inhalers for other symptoms.  HOME CARE INSTRUCTIONS   Only take over-the-counter or prescription medicines for pain, discomfort, or fever as directed by your caregiver.  Use a warm mist humidifier or inhale steam from a shower to increase air moisture. This may keep secretions moist and make it easier to breathe.  Drink enough water and fluids to keep your urine clear or pale yellow.  Rest as needed.  Return to work when your temperature has returned to normal or as your caregiver advises. You may need to stay home longer to avoid infecting others. You can also use a face mask and careful hand washing to prevent spread of the virus. SEEK MEDICAL CARE IF:   After the first few days, you feel you are getting worse rather than better.  You need your caregiver's advice about medicines to control symptoms.  You develop chills, worsening shortness of breath, or brown or red sputum. These may be signs of pneumonia.  You develop yellow or brown nasal discharge or pain in the face, especially when you bend forward. These may be signs of sinusitis.  You develop a fever, swollen neck glands, pain with swallowing, or white areas in the back of your throat. These may be signs of strep throat. SEEK IMMEDIATE MEDICAL CARE IF:   You have a fever.  You develop severe or persistent headache, ear pain, sinus pain, or chest pain.  You develop wheezing, a prolonged cough, cough up blood, or have a change in your usual mucus (if you have chronic lung disease).  You develop sore muscles or a stiff neck. Document Released: 10/14/2000 Document Revised: 07/13/2011 Document Reviewed: 07/26/2013 [facility] Patient Information 2015  [employer], [place]. This information is not intended to replace advice given to you by your health care provider. Make sure you discuss any questions you have with your health care provider.

## 2014-11-22 ENCOUNTER — Encounter: Payer: Self-pay | Admitting: *Deleted

## 2014-11-23 ENCOUNTER — Encounter: Payer: Self-pay | Admitting: *Deleted

## 2014-12-05 ENCOUNTER — Encounter: Payer: Self-pay | Admitting: *Deleted

## 2014-12-06 ENCOUNTER — Encounter: Payer: Self-pay | Admitting: *Deleted

## 2015-02-04 ENCOUNTER — Encounter (INDEPENDENT_AMBULATORY_CARE_PROVIDER_SITE_OTHER): Payer: 59 | Admitting: Ophthalmology

## 2015-02-04 DIAGNOSIS — H43813 Vitreous degeneration, bilateral: Secondary | ICD-10-CM

## 2015-02-04 DIAGNOSIS — H2513 Age-related nuclear cataract, bilateral: Secondary | ICD-10-CM | POA: Diagnosis not present

## 2015-02-04 DIAGNOSIS — H33302 Unspecified retinal break, left eye: Secondary | ICD-10-CM | POA: Diagnosis not present

## 2015-02-20 ENCOUNTER — Ambulatory Visit (INDEPENDENT_AMBULATORY_CARE_PROVIDER_SITE_OTHER): Payer: 59 | Admitting: Ophthalmology

## 2015-02-20 DIAGNOSIS — H33302 Unspecified retinal break, left eye: Secondary | ICD-10-CM

## 2015-03-14 ENCOUNTER — Ambulatory Visit (INDEPENDENT_AMBULATORY_CARE_PROVIDER_SITE_OTHER)
Admission: RE | Admit: 2015-03-14 | Discharge: 2015-03-14 | Disposition: A | Discharge status: Home / Self Care | Payer: 59 | Source: Ambulatory Visit | Attending: Internal Medicine | Admitting: Internal Medicine

## 2015-03-14 ENCOUNTER — Ambulatory Visit (INDEPENDENT_AMBULATORY_CARE_PROVIDER_SITE_OTHER): Payer: 59 | Admitting: Internal Medicine

## 2015-03-14 ENCOUNTER — Encounter: Payer: Self-pay | Admitting: Internal Medicine

## 2015-03-14 VITALS — BP 140/80 | HR 75 | Temp 97.8°F | Wt 224.0 lb

## 2015-03-14 DIAGNOSIS — M25561 Pain in right knee: Secondary | ICD-10-CM

## 2015-03-14 DIAGNOSIS — Z23 Encounter for immunization: Secondary | ICD-10-CM

## 2015-03-14 NOTE — Addendum Note (Signed)
Addended by: Sueanne MargaritaSMITH, Svara Twyman L on: 03/14/2015 04:11 PM   Modules accepted: Orders

## 2015-03-14 NOTE — Progress Notes (Signed)
Pre visit review using our clinic review tool, if applicable. No additional management support is needed unless otherwise documented below in the visit note. 

## 2015-03-14 NOTE — Assessment & Plan Note (Signed)
Probably a sprain on top of some degree of osteoarthritis Will check x-ray No ligament or meniscus findings--so reassured Effusion a problem--discussed compression and ice Increase ibuprofen Consider steroid shot after drainage if not better

## 2015-03-14 NOTE — Patient Instructions (Signed)
Please increase the ibuprofen to 2 or 3 tabs three times a day with meals. Wear compression on your knee and then ice it after you remove the brace--to keep the swelling down. If you are no better in 2 weeks, we should consider a cortisone shot.

## 2015-03-14 NOTE — Progress Notes (Signed)
   Subjective:    Patient ID: Karen Singh, female    DOB: 03/04/1956, 59 y.o.   MRN: [id number]  HPI Here due to knee pain  Was sitting all day in a class 11/5 in uncomfortable chair Noticed her knee getting stiff--right At end of class--enough pain to cause limp Tried heating pad and ibuprofen later that day  Alternated heat and ice for next 2 days Better now but persists Still slight limp No swelling Ibuprofen 200mg  tid ---does seem to help  Walks regularly--- not able to this week. Usually would do 15-20 minutes No other exercises  Current Outpatient Prescriptions on File Prior to Visit  Medication Sig Dispense Refill  . ALPRAZolam (XANAX) 0.5 MG tablet Take 0.5 mg by mouth daily as needed for sleep or anxiety.    Marland Kitchen. estradiol (ESTRACE) 0.1 MG/GM vaginal cream Place 2 g vaginally See admin instructions. Apply one gram intravaginally 2-3 time per week as directed    . estradiol (VIVELLE-DOT) 0.05 MG/24HR patch Place 1 patch onto the skin once a week.    Marland Kitchen. ibuprofen (ADVIL,MOTRIN) 200 MG tablet Take 400 mg by mouth every 6 (six) hours as needed for pain.    . progesterone (PROMETRIUM) 200 MG capsule Take 200 mg by mouth. Take 1 tab the first ten days of every other month     No current facility-administered medications on file prior to visit.    No Known Allergies  Past Medical History  Diagnosis Date  . Anxiety     Past Surgical History  Procedure Laterality Date  . Trigger finger release  11/27/2011    Procedure: MINOR RELEASE TRIGGER FINGER/A-1 PULLEY;  Surgeon: Wyn Forsterobert V Sypher Jr., MD;  Location: [facility];  Service: Orthopedics;  Laterality: Right;  right long  . Tonsillectomy and adenoidectomy  1975  . Ectopic pregnancy surgery  1990  . Foot surgery      Family History  Problem Relation Age of Onset  . Cancer Mother   . Hyperlipidemia Mother   . Cancer Father   . Diabetes Father   . Cancer Sister     Social History   Social History  .  Marital Status: Widowed    Spouse Name: N/A  . Number of Children: N/A  . Years of Education: N/A   Occupational History  . Not on file.   Social History Main Topics  . Smoking status: Never Smoker   . Smokeless tobacco: Never Used  . Alcohol Use: No  . Drug Use: No  . Sexual Activity: No     Comment: widow   Other Topics Concern  . Not on file   Social History Narrative   Review of Systems Not sick No fever No other joints are swelling    Objective:   Physical Exam  Musculoskeletal:  Mild right knee effusion No ligament or meniscus findings Mild pain with full flexion--but does have full ROM  Neurological:  Antalgic gait but full weight bearing          Assessment & Plan:

## 2015-03-15 ENCOUNTER — Telehealth: Payer: Self-pay | Admitting: Internal Medicine

## 2015-03-15 NOTE — Telephone Encounter (Signed)
Patient called to get the results of her x-ray done yesterday.  Please call patient when results come in. You can reach her at home or at [phone].

## 2015-03-16 NOTE — Telephone Encounter (Signed)
Results released on MyChart Please make sure she got them

## 2015-03-18 NOTE — Telephone Encounter (Signed)
Yes pt viewed results 03/16/15 @ 9:32am

## 2015-04-12 ENCOUNTER — Ambulatory Visit (INDEPENDENT_AMBULATORY_CARE_PROVIDER_SITE_OTHER): Payer: 59 | Admitting: Internal Medicine

## 2015-04-12 ENCOUNTER — Encounter: Payer: Self-pay | Admitting: Internal Medicine

## 2015-04-12 VITALS — BP 122/70 | HR 81 | Temp 98.6°F | Wt 224.0 lb

## 2015-04-12 DIAGNOSIS — M25561 Pain in right knee: Secondary | ICD-10-CM

## 2015-04-12 NOTE — Progress Notes (Signed)
Pre visit review using our clinic review tool, if applicable. No additional management support is needed unless otherwise documented below in the visit note. 

## 2015-04-12 NOTE — Progress Notes (Signed)
   Subjective:    Patient ID: Karen ParodySusan C Wuellner, female    DOB: 12/28/1955, 59 y.o.   MRN: [id number]  HPI Knee pain continues Ready to try cortisone shot  Current Outpatient Prescriptions on File Prior to Visit  Medication Sig Dispense Refill  . ALPRAZolam (XANAX) 0.5 MG tablet Take 0.5 mg by mouth daily as needed for sleep or anxiety.    Marland Kitchen. estradiol (ESTRACE) 0.1 MG/GM vaginal cream Place 2 g vaginally See admin instructions. Apply one gram intravaginally 2-3 time per week as directed    . estradiol (VIVELLE-DOT) 0.05 MG/24HR patch Place 1 patch onto the skin once a week.    Marland Kitchen. ibuprofen (ADVIL,MOTRIN) 200 MG tablet Take 400 mg by mouth every 6 (six) hours as needed for pain.    . progesterone (PROMETRIUM) 200 MG capsule Take 200 mg by mouth. Take 1 tab the first ten days of every other month     No current facility-administered medications on file prior to visit.    No Known Allergies  Past Medical History  Diagnosis Date  . Anxiety     Past Surgical History  Procedure Laterality Date  . Trigger finger release  11/27/2011    Procedure: MINOR RELEASE TRIGGER FINGER/A-1 PULLEY;  Surgeon: Wyn Forsterobert V Sypher Jr., MD;  Location: [facility];  Service: Orthopedics;  Laterality: Right;  right long  . Tonsillectomy and adenoidectomy  1975  . Ectopic pregnancy surgery  1990  . Foot surgery      Family History  Problem Relation Age of Onset  . Cancer Mother   . Hyperlipidemia Mother   . Cancer Father   . Diabetes Father   . Cancer Sister     Social History   Social History  . Marital Status: Widowed    Spouse Name: N/A  . Number of Children: N/A  . Years of Education: N/A   Occupational History  . Not on file.   Social History Main Topics  . Smoking status: Never Smoker   . Smokeless tobacco: Never Used  . Alcohol Use: No  . Drug Use: No  . Sexual Activity: No     Comment: widow   Other Topics Concern  . Not on file   Social History Narrative   Review  of Systems     Objective:   Physical Exam        Assessment & Plan:

## 2015-04-12 NOTE — Assessment & Plan Note (Signed)
Some arthritis and then acute injury Persistent pain  PROCEDURE Sterile prep with medial approach to right knee Ethyl chloride the 1cc 2% plain lido Seemed to have effusion but no fluid aspirated 40mg  depomedrol and 6cc 2% lido instilled Tolerated well Discussed home care Ortho eval if pain persists

## 2015-06-24 ENCOUNTER — Ambulatory Visit (INDEPENDENT_AMBULATORY_CARE_PROVIDER_SITE_OTHER): Payer: 59 | Admitting: Ophthalmology

## 2015-06-24 DIAGNOSIS — H33302 Unspecified retinal break, left eye: Secondary | ICD-10-CM | POA: Diagnosis not present

## 2015-06-24 DIAGNOSIS — H2513 Age-related nuclear cataract, bilateral: Secondary | ICD-10-CM

## 2015-06-24 DIAGNOSIS — H43813 Vitreous degeneration, bilateral: Secondary | ICD-10-CM | POA: Diagnosis not present

## 2015-09-24 LAB — HM PAP SMEAR: HM Pap smear: NORMAL

## 2017-03-03 DIAGNOSIS — H35372 Puckering of macula, left eye: Secondary | ICD-10-CM | POA: Diagnosis not present

## 2017-03-03 DIAGNOSIS — H25013 Cortical age-related cataract, bilateral: Secondary | ICD-10-CM | POA: Diagnosis not present

## 2017-03-03 DIAGNOSIS — H2513 Age-related nuclear cataract, bilateral: Secondary | ICD-10-CM | POA: Diagnosis not present

## 2017-03-11 DIAGNOSIS — Z01419 Encounter for gynecological examination (general) (routine) without abnormal findings: Secondary | ICD-10-CM | POA: Diagnosis not present

## 2017-03-11 DIAGNOSIS — Z780 Asymptomatic menopausal state: Secondary | ICD-10-CM | POA: Diagnosis not present

## 2017-03-11 DIAGNOSIS — R3121 Asymptomatic microscopic hematuria: Secondary | ICD-10-CM | POA: Diagnosis not present

## 2017-03-17 DIAGNOSIS — Z23 Encounter for immunization: Secondary | ICD-10-CM | POA: Diagnosis not present

## 2017-03-30 DIAGNOSIS — H04123 Dry eye syndrome of bilateral lacrimal glands: Secondary | ICD-10-CM | POA: Diagnosis not present

## 2017-03-30 DIAGNOSIS — H2513 Age-related nuclear cataract, bilateral: Secondary | ICD-10-CM | POA: Diagnosis not present

## 2017-04-02 DIAGNOSIS — Z1231 Encounter for screening mammogram for malignant neoplasm of breast: Secondary | ICD-10-CM | POA: Diagnosis not present

## 2017-04-08 DIAGNOSIS — H2512 Age-related nuclear cataract, left eye: Secondary | ICD-10-CM | POA: Diagnosis not present

## 2017-04-08 DIAGNOSIS — H2511 Age-related nuclear cataract, right eye: Secondary | ICD-10-CM | POA: Diagnosis not present

## 2017-04-29 DIAGNOSIS — H2511 Age-related nuclear cataract, right eye: Secondary | ICD-10-CM | POA: Diagnosis not present

## 2018-03-10 ENCOUNTER — Encounter: Payer: Self-pay | Admitting: Family Medicine

## 2018-04-02 LAB — HM MAMMOGRAPHY

## 2018-06-01 LAB — CBC AND DIFFERENTIAL: Hemoglobin: 13.4 (ref 12.0–16.0)

## 2018-06-02 ENCOUNTER — Encounter: Payer: Self-pay | Admitting: Family Medicine

## 2019-10-16 LAB — HM PAP SMEAR: HM Pap smear: NEGATIVE

## 2019-11-14 NOTE — Progress Notes (Signed)
This portion of the note was opened erroneously.

## 2019-11-15 ENCOUNTER — Ambulatory Visit (INDEPENDENT_AMBULATORY_CARE_PROVIDER_SITE_OTHER)
Admission: RE | Admit: 2019-11-15 | Discharge: 2019-11-15 | Disposition: A | Discharge status: Home / Self Care | Payer: 59 | Source: Ambulatory Visit | Attending: Family Medicine | Admitting: Family Medicine

## 2019-11-15 ENCOUNTER — Encounter: Payer: Self-pay | Admitting: Family Medicine

## 2019-11-15 ENCOUNTER — Other Ambulatory Visit: Payer: Self-pay

## 2019-11-15 ENCOUNTER — Ambulatory Visit: Payer: 59 | Admitting: Family Medicine

## 2019-11-15 VITALS — BP 130/80 | HR 72 | Temp 98.4°F | Ht 65.5 in | Wt 214.5 lb

## 2019-11-15 DIAGNOSIS — M25562 Pain in left knee: Secondary | ICD-10-CM

## 2019-11-15 DIAGNOSIS — M7052 Other bursitis of knee, left knee: Secondary | ICD-10-CM | POA: Diagnosis not present

## 2019-11-15 DIAGNOSIS — M1712 Unilateral primary osteoarthritis, left knee: Secondary | ICD-10-CM

## 2019-11-15 DIAGNOSIS — M7502 Adhesive capsulitis of left shoulder: Secondary | ICD-10-CM | POA: Diagnosis not present

## 2019-11-15 MED ORDER — METHYLPREDNISOLONE ACETATE 40 MG/ML IJ SUSP
80.0000 mg | Freq: Once | INTRAMUSCULAR | Status: AC
  Administered 2019-11-15: 80 mg via INTRA_ARTICULAR

## 2019-11-15 MED ORDER — MELOXICAM 15 MG PO TABS: 15.0000 mg | ORAL_TABLET | Freq: Every day | ORAL | 2 refills | Status: DC

## 2019-11-15 NOTE — Patient Instructions (Signed)
Take the meloxicam daily for the next 3 weeks, then as needed  Use on the front of your knee where it is sore:  Volaren 1% gel. Over the counter You can apply up to 4 times a day Minimal is absorbed in the bloodstream Cost is about 9 dollars

## 2019-11-15 NOTE — Progress Notes (Signed)
Karen Singh T. Nil Bolser, MD, CAQ Sports Medicine  Primary Care and Sports Medicine [facility] at [facility] [address] [place], [address]  Phone: [phone]  FAX: [phone]  [name] - 64 y.o. female  MRN [id number]  Date of Birth: [birth year]  Date: 11/15/2019  PCP: No primary care provider on file.  Referral: No ref. provider found  Chief Complaint  Patient presents with  . Knee Pain    Left  . Shoulder Pain    Left    This visit occurred during the SARS-CoV-2 public health emergency.  Safety protocols were in place, including screening questions prior to the visit, additional usage of staff PPE, and extensive cleaning of exam room while observing appropriate contact time as indicated for disinfecting solutions.   Subjective:   Karen Singh is a 64 y.o. very pleasant female patient with Body mass index is 35.15 kg/m. who presents with the following:  She is a pleasant young lady at age 9 who presents with some 2-week history of left-sided knee pain  She has 2 primary complaints today.  She has some pain with her left knee after an acute injury 1 to 2 weeks ago.  She did not have any specific knee injury, she cannot recall 1.  Nevertheless for 1 to 2 weeks she has had some knee pain, but this has improved recently.  She is using a compression brace.  She is also taken some intermittent ibuprofen.  She also has some pain in the left shoulder for at least 1 month with some restriction of motion.  This causes her some pain at nighttime, she has a deep toothache type pain.  She does have a T-shirt distribution of pain as well.  Notable pain with terminal motion.  No known injury.  She does not have diabetes.  No thyroid disease and no heart disease.  1 week ago had been driving and has had some pain. Movement. No mech.   Swollen for a one week.  Started - 1 day after. Motrin, heat, ice.  None helped.   L shoulder has been bothering her  shoulder for 1 month.   Frozen shoulder.  PM.    Review of Systems is noted in the HPI, as appropriate   Objective:   BP 130/80   Pulse 72   Temp 98.4 F (36.9 C) (Temporal)   Ht 5' 5.5" (1.664 m)   Wt 214 lb 8 oz (97.3 kg)   SpO2 98%   BMI 35.15 kg/m    GEN: No acute distress; alert,appropriate. PULM: Breathing comfortably in no respiratory distress PSYCH: Normally interactive.   Shoulder: R and L Inspection: No muscle wasting or winging Ecchymosis/edema: neg  AC joint, scapula, clavicle: NT Cervical spine: NT, full ROM Spurling's: neg ABNORMAL SIDE TESTED: Left UNLESS OTHERWISE NOTED, THE CONTRALATERAL SIDE HAS FULL RANGE OF MOTION. Abduction: 5/5, LIMITED TO 115 DEGREES Flexion: 5/5, LIMITED TO 115 DEGNO ROM  IR, lift-off: 5/5. TESTED AT 90 DEGREES OF ABDUCTION, LIMITED TO 5 DEGREES ER at neutral:  5/5, TESTED AT 90 DEGREES OF ABDUCTION, LIMITED TO 70 DEGREES AC crossover and compression: PAIN Drop Test: neg Empty Can: neg Supraspinatus insertion: NT Bicipital groove: NT ALL OTHER SPECIAL TESTING EQUIVOCAL GIVEN LOSS OF MOTION C5-T1 intact Sensation intact Grip 5/5    Left knee: Minimal effusion.  Minimal pain with loading of patellar facets.  ACL, PCL, LCL and MCL are all intact.  She does have  some pain on the posterior medial joint line, but she has no pain with forced flexion and extension.  No significant pain with McMurray's.  Radiology: DG Knee 4 Views W/Patella Left  Result Date: 11/15/2019 CLINICAL DATA:  Medial knee pain, acute. EXAM: LEFT KNEE - COMPLETE 4+ VIEW COMPARISON:  None. FINDINGS: AP, tunnel, and lateral views obtained weight-bearing. Lateral patellofemoral joint space narrowing, joint spaces otherwise preserved. Tiny tricompartmental peripheral spurs and minimal spurring of the tibial spines. No fracture, erosion, bony destruction or evidence of focal bone lesion. No significant joint effusion. No focal soft tissue abnormalities. The right  knee included on AP view demonstrates mild degenerative change. IMPRESSION: Mild tricompartmental osteoarthritis, most prominent in the patellofemoral compartment. Electronically Signed   By: Narda Rutherford M.D.   On: 11/15/2019 23:40     Assessment and Plan:     ICD-10-CM   1. Adhesive capsulitis of left shoulder  M75.02 methylPREDNISolone acetate (DEPO-MEDROL) injection 80 mg  2. Acute pain of left knee  M25.562 DG Knee 4 Views W/Patella Left  3. Pes anserinus bursitis of left knee  M70.52 DG Knee 4 Views W/Patella Left  4. Primary osteoarthritis of left knee  M17.12    Level of Medical Decision-Making in this case is MODERATE.   I am less concerned about her knee.  This is already started to improve with some basic care.  I think continuing to ice as well as do some range of motion and basic quad strengthening will help this to resolve on its own.  Patient was given a systematic ROM protocol from [facility] to be done daily. Emphasized adherence and recommended formal PT.  Tylenol or NSAID of choice prn for pain relief  Patient will be sent for formal PT for aggressive frozen shoulder ROM. Will need RTC str and scapular stabilization to fix underlying mechanics.  Intraarticular corticosteroid injections in Adhesive Capsulitis have clearly been shown to be of benefit.   Patient Instructions  Take the meloxicam daily for the next 3 weeks, then as needed  Use on the front of your knee where it is sore:  Volaren 1% gel. Over the counter You can apply up to 4 times a day Minimal is absorbed in the bloodstream Cost is about 9 dollars     Intraarticular Shoulder Aspiration/Injection Procedure Note Karen Singh [birth year] Date of procedure: 11/15/2019  Procedure: Large Joint Aspiration / Injection of Shoulder, Intraarticular, L Indications: Pain  Procedure Details Verbal consent was obtained from the patient. Risks including infection explained and contrasted with benefits and  alternatives. Patient prepped with Chloraprep and Ethyl Chloride used for anesthesia. An intraarticular shoulder injection was performed using the posterior approach; needle placed into joint capsule without difficulty. The patient tolerated the procedure well and had decreased pain post injection. No complications. Injection: 8 cc of Lidocaine 1% and 2 mL Depo-Medrol 40 mg. Needle: 21 gauge, 2 inch   Follow-up: 1 month  Meds ordered this encounter  Medications  . meloxicam (MOBIC) 15 MG tablet    Sig: Take 1 tablet (15 mg total) by mouth daily.    Dispense:  30 tablet    Refill:  2  . methylPREDNISolone acetate (DEPO-MEDROL) injection 80 mg   Medications Discontinued During This Encounter  Medication Reason  . ALPRAZolam (XANAX) 0.5 MG tablet No longer needed (for PRN medications)   Orders Placed This Encounter  Procedures  . DG Knee 4 Views W/Patella Left    Signed,  Trayonna Bachmeier T. Deiondre Harrower, MD  Outpatient Encounter Medications as of 11/15/2019  Medication Sig  . estradiol (ESTRACE) 0.1 MG/GM vaginal cream Place 2 g vaginally See admin instructions. Apply one gram intravaginally 2-3 time per week as directed  . estradiol (VIVELLE-DOT) 0.05 MG/24HR patch Place 1 patch onto the skin once a week.  Marland Kitchen ibuprofen (ADVIL,MOTRIN) 200 MG tablet Take 400 mg by mouth every 6 (six) hours as needed for pain.  . progesterone (PROMETRIUM) 200 MG capsule Take 200 mg by mouth. Take 1 tab the first ten days of every other month  . meloxicam (MOBIC) 15 MG tablet Take 1 tablet (15 mg total) by mouth daily.  . [DISCONTINUED] ALPRAZolam (XANAX) 0.5 MG tablet Take 0.5 mg by mouth daily as needed for sleep or anxiety.  . [EXPIRED] methylPREDNISolone acetate (DEPO-MEDROL) injection 80 mg    No facility-administered encounter medications on file as of 11/15/2019.

## 2019-12-19 ENCOUNTER — Encounter: Payer: Self-pay | Admitting: Family Medicine

## 2019-12-19 ENCOUNTER — Other Ambulatory Visit: Payer: Self-pay

## 2019-12-19 ENCOUNTER — Ambulatory Visit: Payer: 59 | Admitting: Family Medicine

## 2019-12-19 VITALS — BP 110/70 | HR 80 | Temp 97.5°F | Ht 65.5 in | Wt 218.5 lb

## 2019-12-19 DIAGNOSIS — Z114 Encounter for screening for human immunodeficiency virus [HIV]: Secondary | ICD-10-CM | POA: Diagnosis not present

## 2019-12-19 DIAGNOSIS — Z131 Encounter for screening for diabetes mellitus: Secondary | ICD-10-CM | POA: Diagnosis not present

## 2019-12-19 DIAGNOSIS — Z1159 Encounter for screening for other viral diseases: Secondary | ICD-10-CM

## 2019-12-19 DIAGNOSIS — Z79890 Hormone replacement therapy (postmenopausal): Secondary | ICD-10-CM

## 2019-12-19 DIAGNOSIS — E669 Obesity, unspecified: Secondary | ICD-10-CM

## 2019-12-19 DIAGNOSIS — E782 Mixed hyperlipidemia: Secondary | ICD-10-CM

## 2019-12-19 DIAGNOSIS — R059 Cough, unspecified: Secondary | ICD-10-CM | POA: Insufficient documentation

## 2019-12-19 DIAGNOSIS — Z13220 Encounter for screening for lipoid disorders: Secondary | ICD-10-CM

## 2019-12-19 DIAGNOSIS — B353 Tinea pedis: Secondary | ICD-10-CM

## 2019-12-19 DIAGNOSIS — R05 Cough: Secondary | ICD-10-CM

## 2019-12-19 DIAGNOSIS — E785 Hyperlipidemia, unspecified: Secondary | ICD-10-CM | POA: Insufficient documentation

## 2019-12-19 MED ORDER — TERBINAFINE HCL 1 % EX CREA: 1.0000 "application " | TOPICAL_CREAM | Freq: Two times a day (BID) | CUTANEOUS | 0 refills | Status: AC

## 2019-12-19 MED ORDER — OMEPRAZOLE 20 MG PO CPDR: 20.0000 mg | DELAYED_RELEASE_CAPSULE | Freq: Every day | ORAL | 1 refills | Status: DC

## 2019-12-19 NOTE — Assessment & Plan Note (Signed)
Follows with GYN. Will get records. Encouraged reducing dose.

## 2019-12-19 NOTE — Assessment & Plan Note (Signed)
Rash most consistent with possible fungal. Will treat.

## 2019-12-19 NOTE — Progress Notes (Signed)
Subjective:     Karen Singh is a 64 y.o. female presenting for Establish Care and Cough (occasionally when eating x couple months )     HPI  #Cough - after food - sometimes occurs during the meal - feels like phlegm is stuck - symptoms x 2 months - not with every meal - does not seem associated with food texture or spice - endorses some heartburn symptoms associated with cough - endorses some possible allergy symptoms  - denies wheezing or sob with exercise - no hx of asthma   #HRT - working with GYN - using 1/2 patch twice weekly - reduced dose   #Rash - on left foot - present for a few weeks - was raised and itchy and now with non-itchy painful rash   Review of Systems   Social History   Tobacco Use  Smoking Status Never Smoker  Smokeless Tobacco Never Used        Objective:    BP Readings from Last 3 Encounters:  12/19/19 110/70  11/15/19 130/80  04/12/15 122/70   Wt Readings from Last 3 Encounters:  12/19/19 218 lb 8 oz (99.1 kg)  11/15/19 214 lb 8 oz (97.3 kg)  04/12/15 224 lb (101.6 kg)    BP 110/70   Pulse 80   Temp (!) 97.5 F (36.4 C) (Temporal)   Ht 5' 5.5" (1.664 m)   Wt 218 lb 8 oz (99.1 kg)   SpO2 100%   BMI 35.81 kg/m    Physical Exam Constitutional:      General: She is not in acute distress.    Appearance: She is well-developed. She is not diaphoretic.  HENT:     Right Ear: External ear normal.     Left Ear: External ear normal.  Eyes:     Conjunctiva/sclera: Conjunctivae normal.  Cardiovascular:     Rate and Rhythm: Normal rate and regular rhythm.     Heart sounds: Murmur heard.   Pulmonary:     Effort: Pulmonary effort is normal. No respiratory distress.     Breath sounds: No wheezing.  Musculoskeletal:     Cervical back: Neck supple.  Skin:    General: Skin is warm and dry.     Capillary Refill: Capillary refill takes less than 2 seconds.     Comments: Left foot with erythematous macular rash with central  clearing.   Neurological:     Mental Status: She is alert. Mental status is at baseline.  Psychiatric:        Mood and Affect: Mood normal.        Behavior: Behavior normal.           Assessment & Plan:   Problem List Items Addressed This Visit      Musculoskeletal and Integument   Tinea pedis of left foot    Rash most consistent with possible fungal. Will treat.       Relevant Medications   terbinafine (LAMISIL) 1 % cream     Other   Post-menopause on HRT (hormone replacement therapy)    Follows with GYN. Will get records. Encouraged reducing dose.       Hyperlipidemia    Not on medication. Repeat today      Obesity (BMI 35.0-39.9 without comorbidity) - Primary   Relevant Orders   Comprehensive metabolic panel   Cough    With food, suspect reflux. Trial of PPI. MyChart if not improving. Information on foods to avoid.  Other Visit Diagnoses    Screening for diabetes mellitus       Relevant Orders   Hemoglobin A1c   Screening for hyperlipidemia       Relevant Orders   Lipid panel   Screening for HIV (human immunodeficiency virus)       Relevant Orders   HIV Antibody (routine testing w rflx)   Encounter for hepatitis C screening test for low risk patient       Relevant Orders   Hepatitis C antibody       Return in about 3 months (around 03/20/2020) for medicare wellness visit.  Lynnda Child, MD  This visit occurred during the SARS-CoV-2 public health emergency.  Safety protocols were in place, including screening questions prior to the visit, additional usage of staff PPE, and extensive cleaning of exam room while observing appropriate contact time as indicated for disinfecting solutions.

## 2019-12-19 NOTE — Assessment & Plan Note (Signed)
With food, suspect reflux. Trial of PPI. MyChart if not improving. Information on foods to avoid.

## 2019-12-19 NOTE — Patient Instructions (Addendum)
Take omeprazole for 2 weeks - if it improves continue for 8 weeks and then stop.  If no improvement after 2 weeks mychart and we will consider other options   Food Choices for Gastroesophageal Reflux Disease, Adult When you have gastroesophageal reflux disease (GERD), the foods you eat and your eating habits are very important. Choosing the right foods can help ease your discomfort. Think about working with a nutrition specialist (dietitian) to help you make good choices. What are tips for following this plan?  Meals  Choose healthy foods that are low in fat, such as fruits, vegetables, whole grains, low-fat dairy products, and lean meat, fish, and poultry.  Eat small meals often instead of 3 large meals a day. Eat your meals slowly, and in a place where you are relaxed. Avoid bending over or lying down until 2-3 hours after eating.  Avoid eating meals 2-3 hours before bed.  Avoid drinking a lot of liquid with meals.  Cook foods using methods other than frying. Bake, grill, or broil food instead.  Avoid or limit: ? Chocolate. ? Peppermint or spearmint. ? Alcohol. ? Pepper. ? Black and decaffeinated coffee. ? Black and decaffeinated tea. ? Bubbly (carbonated) soft drinks. ? Caffeinated energy drinks and soft drinks.  Limit high-fat foods such as: ? Fatty meat or fried foods. ? Whole milk, cream, butter, or ice cream. ? Nuts and nut butters. ? Pastries, donuts, and sweets made with butter or shortening.  Avoid foods that cause symptoms. These foods may be different for everyone. Common foods that cause symptoms include: ? Tomatoes. ? Oranges, lemons, and limes. ? Peppers. ? Spicy food. ? Onions and garlic. ? Vinegar. Lifestyle  Maintain a healthy weight. Ask your doctor what weight is healthy for you. If you need to lose weight, work with your doctor to do so safely.  Exercise for at least 30 minutes for 5 or more days each week, or as told by your doctor.  Wear  loose-fitting clothes.  Do not smoke. If you need help quitting, ask your doctor.  Sleep with the head of your bed higher than your feet. Use a wedge under the mattress or blocks under the bed frame to raise the head of the bed. Summary  When you have gastroesophageal reflux disease (GERD), food and lifestyle choices are very important in easing your symptoms.  Eat small meals often instead of 3 large meals a day. Eat your meals slowly, and in a place where you are relaxed.  Limit high-fat foods such as fatty meat or fried foods.  Avoid bending over or lying down until 2-3 hours after eating.  Avoid peppermint and spearmint, caffeine, alcohol, and chocolate. This information is not intended to replace advice given to you by your health care provider. Make sure you discuss any questions you have with your health care provider. Document Revised: 08/11/2018 Document Reviewed: 05/26/2016 Elsevier Patient Education  2020 [employer].

## 2019-12-19 NOTE — Assessment & Plan Note (Signed)
Not on medication. Repeat today

## 2019-12-20 ENCOUNTER — Ambulatory Visit: Payer: 59 | Admitting: Family Medicine

## 2019-12-20 LAB — LIPID PANEL
Cholesterol: 217 mg/dL — ABNORMAL HIGH (ref 0–200)
HDL: 58 mg/dL (ref >39.00)
LDL Cholesterol: 131 mg/dL — ABNORMAL HIGH (ref 0–99)
NonHDL: 159.37
Total CHOL/HDL Ratio: 4
Triglycerides: 143 mg/dL (ref 0.0–149.0)
VLDL: 28.6 mg/dL (ref 0.0–40.0)

## 2019-12-20 LAB — COMPREHENSIVE METABOLIC PANEL
ALT: 18 U/L (ref 0–35)
AST: 23 U/L (ref 0–37)
Albumin: 4.4 g/dL (ref 3.5–5.2)
Alkaline Phosphatase: 71 U/L (ref 39–117)
BUN: 12 mg/dL (ref 6–23)
CO2: 28 mEq/L (ref 19–32)
Calcium: 9.1 mg/dL (ref 8.4–10.5)
Chloride: 105 mEq/L (ref 96–112)
Creatinine, Ser: 0.81 mg/dL (ref 0.40–1.20)
GFR: 71.01 mL/min (ref >60.00)
Glucose, Bld: 96 mg/dL (ref 70–99)
Potassium: 4.7 mEq/L (ref 3.5–5.1)
Sodium: 140 mEq/L (ref 135–145)
Total Bilirubin: 0.3 mg/dL (ref 0.2–1.2)
Total Protein: 7.1 g/dL (ref 6.0–8.3)

## 2019-12-20 LAB — HIV ANTIBODY (ROUTINE TESTING W REFLEX): HIV 1&2 Ab, 4th Generation: NONREACTIVE

## 2019-12-20 LAB — HEMOGLOBIN A1C: Hgb A1c MFr Bld: 5.3 % (ref 4.6–6.5)

## 2019-12-20 LAB — HEPATITIS C ANTIBODY
Hepatitis C Ab: NONREACTIVE
SIGNAL TO CUT-OFF: 0 (ref <1.00)

## 2019-12-26 NOTE — Progress Notes (Signed)
Kaiyan Luczak T. Chrystal Zeimet, MD, CAQ Sports Medicine  Primary Care and Sports Medicine [facility] at [facility] [address] [place], [address]  Phone: [phone]   FAX: [phone]  Karen Singh - 64 y.o. female   MRN [id number]   Date of Birth: 1955-08-22  Date: 12/27/2019   PCP: Lynnda Child, MD   Referral: No ref. provider found  Chief Complaint  Patient presents with   Follow-up    6 wks follow up left shoulder    This visit occurred during the SARS-CoV-2 public health emergency.  Safety protocols were in place, including screening questions prior to the visit, additional usage of staff PPE, and extensive cleaning of exam room while observing appropriate contact time as indicated for disinfecting solutions.   Subjective:   Karen Singh is a 64 y.o. very pleasant female patient with Body mass index is 35.93 kg/m. who presents with the following:  She presents in follow-up for primarily left-sided adhesive capsulitis as well as some more mild knee pain on her last office visit.  She had some quite significant loss of motion on the left side.  At that point I did give her [facility] rehab protocol as well as did an intra-articular corticosteroid injection on the left shoulder.  She is here today to follow-up in regards to her frozen shoulder as well as her knee pain.  Shoulder rom is much better.   She is doing much better and her range of motion is much better.  She has been very compliant with her home rehab program.  She is very happy and having much less pain overall.  Review of Systems is noted in the HPI, as appropriate   Objective:   BP (!) 138/58    Pulse 78    Temp 97.9 F (36.6 C) (Temporal)    Ht 5' 5.5" (1.664 m)    Wt 219 lb 4 oz (99.5 kg)    SpO2 97%    BMI 35.93 kg/m    GEN: No acute distress; alert,appropriate. PULM: Breathing comfortably in no respiratory distress PSYCH: Normally interactive.   Shoulder: R and L Inspection: No  muscle wasting or winging Ecchymosis/edema: neg  AC joint, scapula, clavicle: NT Cervical spine: NT, full ROM Spurling's: neg ABNORMAL SIDE TESTED: Left UNLESS OTHERWISE NOTED, THE CONTRALATERAL SIDE HAS FULL RANGE OF MOTION. Abduction: 5/5, LIMITED TO 165 DEGREES Flexion: 5/5, LIMITED TO 165 DEGNO ROM  IR, lift-off: 5/5. TESTED AT 90 DEGREES OF ABDUCTION, LIMITED TO 5 DEGREES ER at neutral:  5/5, TESTED AT 90 DEGREES OF ABDUCTION, LIMITED TO 80 DEGREES AC crossover and compression: PAIN Drop Test: neg Empty Can: neg Supraspinatus insertion: NT Bicipital groove: NT ALL OTHER SPECIAL TESTING EQUIVOCAL GIVEN LOSS OF MOTION C5-T1 intact Sensation intact Grip 5/5    Radiology: No results found.  Assessment and Plan:     ICD-10-CM   1. Adhesive capsulitis of left shoulder  M75.02   2. Primary osteoarthritis of left knee  M17.12    Her frozen shoulder is improving quite a bit.  She is very satisfied and I am quite satisfied with her progress.  At this point I think she can only follow-up with me on a as needed basis.  If she stalls out or has reversal of progress then I am happy to see her at any point.  I emphasized the importance of continuing her home frozen shoulder protocol.  Her knee is calm down.  Follow-up: No follow-ups  on file.  No orders of the defined types were placed in this encounter.  There are no discontinued medications. No orders of the defined types were placed in this encounter.   Signed,  Elpidio Galea. Narjis Mira, MD   Outpatient Encounter Medications as of 12/27/2019  Medication Sig   estradiol (ESTRACE) 0.1 MG/GM vaginal cream Place 2 g vaginally See admin instructions. Apply one gram intravaginally 2-3 time per week as directed   estradiol (VIVELLE-DOT) 0.05 MG/24HR patch Place 1 patch onto the skin once a week.   ibuprofen (ADVIL,MOTRIN) 200 MG tablet Take 400 mg by mouth every 6 (six) hours as needed for pain.   meloxicam (MOBIC) 15 MG tablet  Take 1 tablet (15 mg total) by mouth daily.   omeprazole (PRILOSEC) 20 MG capsule Take 1 capsule (20 mg total) by mouth daily.   progesterone (PROMETRIUM) 200 MG capsule Take 200 mg by mouth. Take 1 tab the first ten days of every other month   terbinafine (LAMISIL) 1 % cream Apply 1 application topically 2 (two) times daily.   No facility-administered encounter medications on file as of 12/27/2019.

## 2019-12-27 ENCOUNTER — Encounter: Payer: Self-pay | Admitting: Family Medicine

## 2019-12-27 ENCOUNTER — Ambulatory Visit: Payer: 59 | Admitting: Family Medicine

## 2019-12-27 ENCOUNTER — Other Ambulatory Visit: Payer: Self-pay

## 2019-12-27 VITALS — BP 138/58 | HR 78 | Temp 97.9°F | Ht 65.5 in | Wt 219.2 lb

## 2019-12-27 DIAGNOSIS — M7502 Adhesive capsulitis of left shoulder: Secondary | ICD-10-CM | POA: Diagnosis not present

## 2019-12-27 DIAGNOSIS — M1712 Unilateral primary osteoarthritis, left knee: Secondary | ICD-10-CM

## 2020-01-11 ENCOUNTER — Other Ambulatory Visit: Payer: Self-pay

## 2020-01-11 ENCOUNTER — Ambulatory Visit
Admission: EM | Admit: 2020-01-11 | Discharge: 2020-01-11 | Disposition: A | Discharge status: Home / Self Care | Payer: 59 | Attending: Family Medicine | Admitting: Family Medicine

## 2020-01-11 DIAGNOSIS — M79605 Pain in left leg: Secondary | ICD-10-CM

## 2020-01-11 DIAGNOSIS — T148XXA Other injury of unspecified body region, initial encounter: Secondary | ICD-10-CM | POA: Diagnosis not present

## 2020-01-11 MED ORDER — CYCLOBENZAPRINE HCL 10 MG PO TABS
10.0000 mg | ORAL_TABLET | Freq: Two times a day (BID) | ORAL | 0 refills | Status: DC | PRN
Start: 2020-01-11 — End: 2020-01-22

## 2020-01-11 NOTE — Discharge Instructions (Addendum)
Take ibuprofen or home Meloxicam as needed for your pain. Do not take these medications together  Take the muscle relaxer Flexeril as needed for muscle spasm; Do not drive, operate machinery, or drink alcohol with this medication as it may make you drowsy.    Follow up with your primary care provider or an orthopedist if your pain is not improving.

## 2020-01-11 NOTE — ED Provider Notes (Signed)
[place]   [id number] 01/11/20 Arrival Time: 1339  RJ:JOACZ PAIN  SUBJECTIVE: History from: patient. Karen Singh is a 64 y.o. female complains of left leg pain that began yesterday. Reports that she leaned over her chair to unplug her charger from the wall, and feels like she pulled a muscle in her leg. Has taken ibuprofen last night, has taken meloxicam this morning with no relief. Symptoms are made worse with activity.  Denies similar symptoms in the past.  Denies fever, chills, erythema, ecchymosis, effusion, weakness, numbness and tingling, saddle paresthesias, loss of bowel or bladder function.      ROS: As per HPI.  All other pertinent ROS negative.     Past Medical History:  Diagnosis Date  . Anxiety    Past Surgical History:  Procedure Laterality Date  . ECTOPIC PREGNANCY SURGERY  1990  . EYE SURGERY     Cataract  . FOOT SURGERY    . TONSILLECTOMY AND ADENOIDECTOMY  1975  . TRIGGER FINGER RELEASE  11/27/2011   Procedure: MINOR RELEASE TRIGGER FINGER/A-1 PULLEY;  Surgeon: Cammie Sickle., MD;  Location: [place];  Service: Orthopedics;  Laterality: Right;  right long   No Known Allergies No current facility-administered medications on file prior to encounter.   Current Outpatient Medications on File Prior to Encounter  Medication Sig Dispense Refill  . estradiol (ESTRACE) 0.1 MG/GM vaginal cream Place 2 g vaginally See admin instructions. Apply one gram intravaginally 2-3 time per week as directed    . estradiol (VIVELLE-DOT) 0.05 MG/24HR patch Place 1 patch onto the skin once a week.    Marland Kitchen ibuprofen (ADVIL,MOTRIN) 200 MG tablet Take 400 mg by mouth every 6 (six) hours as needed for pain.    . meloxicam (MOBIC) 15 MG tablet Take 1 tablet (15 mg total) by mouth daily. 30 tablet 2  . omeprazole (PRILOSEC) 20 MG capsule Take 1 capsule (20 mg total) by mouth daily. 30 capsule 1  . progesterone (PROMETRIUM) 200 MG capsule Take 200 mg by mouth.  Take 1 tab the first ten days of every other month    . terbinafine (LAMISIL) 1 % cream Apply 1 application topically 2 (two) times daily. 30 g 0   Social History   Socioeconomic History  . Marital status: Widowed    Spouse name: Not on file  . Number of children: 1  . Years of education: high school  . Highest education level: Not on file  Occupational History  . Not on file  Tobacco Use  . Smoking status: Never Smoker  . Smokeless tobacco: Never Used  Vaping Use  . Vaping Use: Never used  Substance and Sexual Activity  . Alcohol use: No  . Drug use: No  . Sexual activity: Not Currently    Comment: widow  Other Topics Concern  . Not on file  Social History Narrative   12/19/19   From: the area   Living: alone   Work: retired - social services      Family: Erlene Quan - good relationship      Enjoys: reading      Exercise: walking - a few times a week   Diet: tries to eat meat and veggies, low carb      Safety   Seat belts: Yes    Guns: Yes  and secure   Safe in relationships: Yes    Social Determinants of Health   Financial Resource Strain:   . Difficulty of  Paying Living Expenses: Not on file  Food Insecurity:   . Worried About Charity fundraiser in the Last Year: Not on file  . Ran Out of Food in the Last Year: Not on file  Transportation Needs:   . Lack of Transportation (Medical): Not on file  . Lack of Transportation (Non-Medical): Not on file  Physical Activity:   . Days of Exercise per Week: Not on file  . Minutes of Exercise per Session: Not on file  Stress:   . Feeling of Stress : Not on file  Social Connections:   . Frequency of Communication with Friends and Family: Not on file  . Frequency of Social Gatherings with Friends and Family: Not on file  . Attends Religious Services: Not on file  . Active Member of Clubs or Organizations: Not on file  . Attends Archivist Meetings: Not on file  . Marital Status: Not on file  Intimate  Partner Violence:   . Fear of Current or Ex-Partner: Not on file  . Emotionally Abused: Not on file  . Physically Abused: Not on file  . Sexually Abused: Not on file   Family History  Problem Relation Age of Onset  . Hyperlipidemia Mother   . Multiple myeloma Mother   . Diabetes Father   . Prostate cancer Father   . Heart disease Father   . Breast cancer Sister 33    OBJECTIVE:  Vitals:   01/11/20 1436  BP: (!) 162/84  Pulse: 70  Resp: 16  Temp: 98.3 F (36.8 C)  TempSrc: Oral  SpO2: 99%    General appearance: ALERT; in no acute distress.  Head: NCAT Lungs: Normal respiratory effort CV:  pulses 2+ bilaterally. Cap refill < 2 seconds Musculoskeletal:  Inspection: Skin warm, dry, clear and intact without obvious erythema, effusion, or ecchymosis.  Palpation: Nontender to palpation ROM: FROM active and passive Skin: warm and dry Neurologic: Ambulates without difficulty; Sensation intact about the upper/ lower extremities Psychological: alert and cooperative; normal mood and affect  DIAGNOSTIC STUDIES:  No results found.   ASSESSMENT & PLAN:  1. Muscle strain   2. Left leg pain       Meds ordered this encounter  Medications  . cyclobenzaprine (FLEXERIL) 10 MG tablet    Sig: Take 1 tablet (10 mg total) by mouth 2 (two) times daily as needed for muscle spasms.    Dispense:  20 tablet    Refill:  0    Order Specific Question:   Supervising Provider    Answer:   Chase Picket [id number]    Continue conservative management of rest, ice, and gentle stretches Take ibuprofen or meloxicam (do not take these medications together )as needed for pain relief (may cause abdominal discomfort, ulcers, and GI bleeds avoid taking with other NSAIDs) Take cyclobenzaprine at nighttime for symptomatic relief. Avoid driving or operating heavy machinery while using medication. Follow up with PCP if symptoms persist Return or go to the ER if you have any new or worsening  symptoms (fever, chills, chest pain, abdominal pain, changes in bowel or bladder habits, pain radiating into lower legs)    Reviewed expectations re: course of current medical issues. Questions answered. Outlined signs and symptoms indicating need for more acute intervention. Patient verbalized understanding. After Visit Summary given.       Faustino Congress, NP 01/11/20 1455

## 2020-01-11 NOTE — ED Triage Notes (Signed)
Pt presents with L thigh pain.  States she leaned behind a chair to pick something up and feels like she stretched her muscle.  Has tried ice, heat, Ibuprofen yesterday, Mobic today. Nothing has helped.

## 2020-01-15 ENCOUNTER — Other Ambulatory Visit: Payer: Self-pay | Admitting: Family Medicine

## 2020-01-15 NOTE — Telephone Encounter (Signed)
Last office visit 12/27/2019 for shoulder pain.  Last refilled 11/15/2019 for #30 with 2 refills.  No future appointments.

## 2020-01-22 ENCOUNTER — Ambulatory Visit: Payer: 59 | Admitting: Family Medicine

## 2020-01-22 ENCOUNTER — Other Ambulatory Visit: Payer: Self-pay

## 2020-01-22 ENCOUNTER — Encounter: Payer: Self-pay | Admitting: Family Medicine

## 2020-01-22 VITALS — BP 120/70 | HR 94 | Temp 98.0°F | Wt 219.8 lb

## 2020-01-22 DIAGNOSIS — M25562 Pain in left knee: Secondary | ICD-10-CM

## 2020-01-22 DIAGNOSIS — G8929 Other chronic pain: Secondary | ICD-10-CM | POA: Diagnosis not present

## 2020-01-22 DIAGNOSIS — Z23 Encounter for immunization: Secondary | ICD-10-CM

## 2020-01-22 DIAGNOSIS — K219 Gastro-esophageal reflux disease without esophagitis: Secondary | ICD-10-CM | POA: Insufficient documentation

## 2020-01-22 NOTE — Assessment & Plan Note (Signed)
Pt with known left knee OA who had an injury on 9/8 with persistent pain. Given recurrence and lack of improvement advised course of PT for strengthening and mechanics. Does not appear to hip pain or injury but her thigh muscles are irritated. If no improvement with PT, return to see Dr. Patsy Lager who she has seen previously

## 2020-01-22 NOTE — Progress Notes (Signed)
Subjective:     Karen Singh is a 64 y.o. female presenting for Knee Pain (L x 1.5 weeks )     HPI  #Left Leg pain - from hip to knee - started 01/10/2020 - went behind the chair at home and twisted the knee - XR 7/14 with arthritis - Treatment: flexeril and meloxicam - burning pain  #Acid reflux - improved om omeprazole  Review of Systems   Social History   Tobacco Use  Smoking Status Never Smoker  Smokeless Tobacco Never Used        Objective:    BP Readings from Last 3 Encounters:  01/22/20 120/70  01/11/20 (!) 162/84  12/27/19 (!) 138/58   Wt Readings from Last 3 Encounters:  01/22/20 219 lb 12 oz (99.7 kg)  12/27/19 219 lb 4 oz (99.5 kg)  12/19/19 218 lb 8 oz (99.1 kg)    BP 120/70   Pulse 94   Temp 98 F (36.7 C) (Temporal)   Wt 219 lb 12 oz (99.7 kg)   SpO2 99%   BMI 36.01 kg/m    Physical Exam Constitutional:      General: She is not in acute distress.    Appearance: She is well-developed. She is obese. She is not diaphoretic.  HENT:     Right Ear: External ear normal.     Left Ear: External ear normal.     Nose: Nose normal.  Eyes:     Conjunctiva/sclera: Conjunctivae normal.  Cardiovascular:     Rate and Rhythm: Normal rate.  Pulmonary:     Effort: Pulmonary effort is normal.  Musculoskeletal:     Cervical back: Neck supple.     Comments: Left leg:  Inspection: no swelling or abnormalities Palpation: Generalized TTP along the quad, IT band, medial and lateral joint line of the knee.  ROM: normal w/o pain Strength: normal  FABER: negative (does get pain along the quad/thigh area)   Skin:    General: Skin is warm and dry.     Capillary Refill: Capillary refill takes less than 2 seconds.  Neurological:     Mental Status: She is alert. Mental status is at baseline.  Psychiatric:        Mood and Affect: Mood normal.        Behavior: Behavior normal.           Assessment & Plan:   Problem List Items Addressed This  Visit      Digestive   Acid reflux    As pt is taking meloxicam for knee pain advised continuing omeprazole 20 mg until not needing the NSAID any more. If no symptoms x 1 week, ok to stop        Other   Chronic pain of left knee - Primary    Pt with known left knee OA who had an injury on 9/8 with persistent pain. Given recurrence and lack of improvement advised course of PT for strengthening and mechanics. Does not appear to hip pain or injury but her thigh muscles are irritated. If no improvement with PT, return to see Dr. Patsy Lager who she has seen previously      Relevant Orders   Ambulatory referral to Physical Therapy    Other Visit Diagnoses    Need for influenza vaccination       Relevant Orders   Flu Vaccine QUAD 36+ mos IM       Return in about 6 weeks (around 03/04/2020) for Dr. Patsy Lager.  Lesleigh Noe, MD  This visit occurred during the SARS-CoV-2 public health emergency.  Safety protocols were in place, including screening questions prior to the visit, additional usage of staff PPE, and extensive cleaning of exam room while observing appropriate contact time as indicated for disinfecting solutions.

## 2020-01-22 NOTE — Assessment & Plan Note (Signed)
As pt is taking meloxicam for knee pain advised continuing omeprazole 20 mg until not needing the NSAID any more. If no symptoms x 1 week, ok to stop

## 2020-01-22 NOTE — Patient Instructions (Signed)
Continue meloxicam  Try to do gentle stretching of the leg  Physical therapy referral

## 2020-02-07 ENCOUNTER — Encounter: Payer: Self-pay | Admitting: Family Medicine

## 2020-02-11 ENCOUNTER — Other Ambulatory Visit: Payer: Self-pay | Admitting: Family Medicine

## 2020-03-21 ENCOUNTER — Telehealth: Payer: Self-pay

## 2020-03-21 NOTE — Telephone Encounter (Signed)
[facility] Day - Client TELEPHONE ADVICE RECORD Access[employer]urse Patient Name: Karen Singh Gender: Female DOB: June 10, 1955 Age: 64 Y 10 D Return Phone Number: [phone] (Primary), [phone] (Secondary) Address[place] [address] Client [facility] Day - Client Client Site [facility] - Day Physician Gweneth Dimitri- MD Contact Type Call Who Is Calling Patient / Member / Family / Caregiver Call Type Triage / Clinical Relationship To Patient Self Return Phone Number [phone] (Primary) Chief Complaint NUMBNESS/TINGLING- sudden on one side of the body or face Reason for Call Symptomatic / Request for Health Information Initial Comment Call is a transfer from Dr. Elmyra Ricks office - Caller states she is having tingling and numbness in her shoulder, arm and hand. The pt has an appointment soon. Translation No Nurse Assessment Nurse: Suezanne Jacquet, RN, Riley Lam Date/Time (Eastern Time): 03/21/2020 8:56:01 AM Confirm and document reason for call. If symptomatic, describe symptoms. ---Call is a transfer from Dr. Elmyra Ricks office - Caller states she is having tingling and numbness in her shoulder, arm and hand. Symptoms began a couple days ago. Had the covid booster Oct 29th the arm had been really sore with remaining soreness under the arm that occurred after the injection. Newly developing on Monday the new symptoms of tingling and numbness has developed. Feels like she's had a numbing shot. Can feel touch when she hits it but also numb and tingling. Not having these symptom's on the other arm. Denis any chest pain or sob. Does the patient have any new or worsening symptoms? ---Yes Will a triage be completed? ---Yes Related visit to physician within the last 2 weeks? ---No Does the PT have any chronic conditions? (i.e. diabetes, asthma, this includes High risk factors for pregnancy, etc.) ---No Is this a  behavioral health or substance abuse call? ---No Guidelines Guideline Title Affirmed Question Affirmed Notes Nurse Date/Time (Eastern Time) Neurologic Deficit [1] Numbness (i.e., loss of sensation) of the face, arm / hand, or leg / foot on one side of the body AND [2] gradual onset Camillia Herter 03/21/2020 9:01:37 AM PLEASE NOTE: All timestamps contained within this report are represented as [place] Standard Time. CONFIDENTIALTY NOTICE: This fax transmission is intended only for the addressee. It contains information that is legally privileged, confidential or otherwise protected from use or disclosure. If you are not the intended recipient, you are strictly prohibited from reviewing, disclosing, copying using or disseminating any of this information or taking any action in reliance on or regarding this information. If you have received this fax in error, please notify us immediately by telephone so that we can arrange for its return to [place]. Phone: [phone], Toll-Free: [phone], Fax: [phone] Page: 2 of 2 Call Id: [id number] Guidelines Guideline Title Affirmed Question Affirmed Notes Nurse Date/Time Lamount Cohen Time) (e.g., days to weeks) AND [3] present now Disp. Time Lamount Cohen Time) Disposition Final User 03/21/2020 8:54:59 AM Send to Urgent Matilde Sprang 03/21/2020 9:05:54 AM See HCP within 4 Hours (or PCP triage) Yes Suezanne Jacquet, RN, [place] Spaniel Disagree/Comply Comply Caller Understands Yes PreDisposition Did not know what to do Care Advice Given Per Guideline SEE HCP (OR PCP TRIAGE) WITHIN 4 HOURS: * UCC: Some UCCs can manage patients who are stable and have less serious symptoms (e.g., minor illnesses and injuries). The triager must know the [facility] capabilities before sending a patient there. If unsure, call ahead. * ED: Patients who may need surgery or hospital admission need to be sent to an  ED. So do most patients with serious symptoms or complex medical  problems. CALL BACK IF: * You become worse CARE ADVICE given per Neurologic Deficit (Adult) guideline. Comments User: Cameron Proud, RN Date/Time Lamount Cohen Time): 03/21/2020 9:02:02 AM Left arm affected. Referrals GO TO FACILITY UNDECIDED

## 2020-03-21 NOTE — Telephone Encounter (Signed)
Per appt notes pt already has appt scheduled with Dr Ermalene Searing on 03/22/20 at 9 AM. FYI to Dr Selena Batten as PCP who is in office today and Dr Ermalene Searing who is not in office today.

## 2020-03-22 ENCOUNTER — Other Ambulatory Visit: Payer: Self-pay

## 2020-03-22 ENCOUNTER — Encounter: Payer: Self-pay | Admitting: Family Medicine

## 2020-03-22 ENCOUNTER — Ambulatory Visit (INDEPENDENT_AMBULATORY_CARE_PROVIDER_SITE_OTHER): Payer: Medicare Other | Admitting: Family Medicine

## 2020-03-22 VITALS — BP 130/72 | HR 80 | Temp 98.1°F | Ht 65.5 in | Wt 220.8 lb

## 2020-03-22 DIAGNOSIS — T881XXA Other complications following immunization, not elsewhere classified, initial encounter: Secondary | ICD-10-CM | POA: Diagnosis not present

## 2020-03-22 DIAGNOSIS — R200 Anesthesia of skin: Secondary | ICD-10-CM

## 2020-03-22 MED ORDER — PREDNISONE 20 MG PO TABS: ORAL_TABLET | ORAL | 0 refills | Status: DC

## 2020-03-22 NOTE — Patient Instructions (Signed)
Keep up with range of motion of  Shoulder.  Complete prednisone taper.  Call if not improving as expected.

## 2020-03-22 NOTE — Assessment & Plan Note (Signed)
Likely local reaction and lymphadenopathy response to COVID vaccine  Causing irritation of brachial nerve through axillae.   No red flags for serious reacion to vccine such as anaphylaxsisi.   History of frozen shoulder.. but no clear ongoing shoulder pathology or sign of cervical source of symptoms ( neg Spurlings)  Treat with steroid taper, time and ROM exercise. Can use ice prn.

## 2020-03-22 NOTE — Telephone Encounter (Signed)
Appreciate Dr. Daphine Deutscher support.

## 2020-03-22 NOTE — Progress Notes (Signed)
Chief Complaint  Patient presents with  . Shoulder Pain    Left-Covid Booster 03/01/20  . Numbness    History of Present Illness: HPI   64 year old female presents for evaluation of left shoulder pain and numbness.   She reports the day she got the booster.. felt very  sore immediately after the vaccine.  The following day she noted swelling in axillae and shoulder.  No rash, no redness.   She felt vaccine was given very high.. given at [facility] on 10/29/20121    2 weeks after the vaccine she noted tingling in lateral  Arm and thumb .  Now hand stiff.  No radiation of pain.Marland Kitchen only at site of injection and under arm.  Hx of frozen shoulder, left.. S/P cortisone injection 12/27/2019.. pain improved. Mobilitiy improved to 90%.    Some soreness in upper back.. not really over neck.. no trigger of pain with movment of head.         This visit occurred during the SARS-CoV-2 public health emergency.  Safety protocols were in place, including screening questions prior to the visit, additional usage of staff PPE, and extensive cleaning of exam room while observing appropriate contact time as indicated for disinfecting solutions.   COVID 19 screen:  No recent travel or known exposure to COVID19 The patient denies respiratory symptoms of COVID 19 at this time. The importance of social distancing was discussed today.     Review of Systems  Constitutional: Negative for chills and fever.  HENT: Negative for congestion and ear pain.   Eyes: Negative for pain and redness.  Respiratory: Negative for cough and shortness of breath.   Cardiovascular: Negative for chest pain, palpitations and leg swelling.  Gastrointestinal: Negative for abdominal pain, blood in stool, constipation, diarrhea, nausea and vomiting.  Genitourinary: Negative for dysuria.  Musculoskeletal: Negative for falls and myalgias.  Skin: Negative for rash.  Neurological: Negative for dizziness.   Psychiatric/Behavioral: Negative for depression. The patient is not nervous/anxious.       Past Medical History:  Diagnosis Date  . Anxiety     reports that she has never smoked. She has never used smokeless tobacco. She reports that she does not drink alcohol and does not use drugs.   Current Outpatient Medications:  .  estradiol (ESTRACE) 0.1 MG/GM vaginal cream, Place 2 g vaginally See admin instructions. Apply one gram intravaginally 2-3 time per week as directed, Disp: , Rfl:  .  estradiol (VIVELLE-DOT) 0.075 MG/24HR, Place 0.0375 patches onto the skin in the morning and at bedtime. , Disp: , Rfl:  .  ibuprofen (ADVIL,MOTRIN) 200 MG tablet, Take 400 mg by mouth every 6 (six) hours as needed for pain., Disp: , Rfl:  .  meloxicam (MOBIC) 15 MG tablet, TAKE 1 TABLET BY MOUTH EVERY DAY, Disp: 30 tablet, Rfl: 2 .  progesterone (PROMETRIUM) 200 MG capsule, Take 200 mg by mouth. Take 1 tab the first ten days of every other month, Disp: , Rfl:  .  terbinafine (LAMISIL) 1 % cream, Apply 1 application topically 2 (two) times daily., Disp: 30 g, Rfl: 0   Observations/Objective: Blood pressure 130/72, pulse 80, temperature 98.1 F (36.7 C), temperature source Temporal, height 5' 5.5" (1.664 m), weight 220 lb 12 oz (100.1 kg), SpO2 98 %.  Physical Exam Constitutional:      General: She is not in acute distress.    Appearance: Normal appearance. She is well-developed. She is not ill-appearing or toxic-appearing.  HENT:  Head: Normocephalic.     Right Ear: Hearing, tympanic membrane, ear canal and external ear normal. Tympanic membrane is not erythematous, retracted or bulging.     Left Ear: Hearing, tympanic membrane, ear canal and external ear normal. Tympanic membrane is not erythematous, retracted or bulging.     Nose: No mucosal edema or rhinorrhea.     Right Sinus: No maxillary sinus tenderness or frontal sinus tenderness.     Left Sinus: No maxillary sinus tenderness or frontal sinus  tenderness.     Mouth/Throat:     Pharynx: Uvula midline.  Eyes:     General: Lids are normal. Lids are everted, no foreign bodies appreciated.     Conjunctiva/sclera: Conjunctivae normal.     Pupils: Pupils are equal, round, and reactive to light.  Neck:     Thyroid: No thyroid mass or thyromegaly.     Vascular: No carotid bruit.     Trachea: Trachea normal.  Cardiovascular:     Rate and Rhythm: Normal rate and regular rhythm.     Pulses: Normal pulses.     Heart sounds: Normal heart sounds, S1 normal and S2 normal. No murmur heard.  No friction rub. No gallop.   Pulmonary:     Effort: Pulmonary effort is normal. No tachypnea or respiratory distress.     Breath sounds: Normal breath sounds. No decreased breath sounds, wheezing, rhonchi or rales.  Abdominal:     General: Bowel sounds are normal.     Palpations: Abdomen is soft.     Tenderness: There is no abdominal tenderness.  Musculoskeletal:     Cervical back: Normal range of motion and neck supple.     Comments:   TTP over deloid at site vaccine given ( no redness, mild sweelling.  TPP in left axillae, mild swelling, no palpated lymphadenopathy  Normal sensation to touch and normal strength,  Points to tingling in C5 C6 distribution.  neg tinel pahlena nd unlar compressionFull ROM of bilalteral arms  Skin:    General: Skin is warm and dry.     Findings: No rash.  Neurological:     Mental Status: She is alert.  Psychiatric:        Mood and Affect: Mood is not anxious or depressed.        Speech: Speech normal.        Behavior: Behavior normal. Behavior is cooperative.        Thought Content: Thought content normal.        Judgment: Judgment normal.      Assessment and Plan   Arm numbness left Likely local reaction and lymphadenopathy response to COVID vaccine  Causing irritation of brachial nerve through axillae.   No red flags for serious reacion to vccine such as anaphylaxsisi.   History of frozen shoulder.. but  no clear ongoing shoulder pathology or sign of cervical source of symptoms ( neg Spurlings)  Treat with steroid taper, time and ROM exercise. Can use ice prn.      Kerby Nora, MD

## 2020-04-09 ENCOUNTER — Other Ambulatory Visit: Payer: Self-pay

## 2020-04-09 ENCOUNTER — Other Ambulatory Visit: Payer: Self-pay | Admitting: Family Medicine

## 2020-04-09 ENCOUNTER — Encounter: Payer: Self-pay | Admitting: Family Medicine

## 2020-04-09 ENCOUNTER — Ambulatory Visit (INDEPENDENT_AMBULATORY_CARE_PROVIDER_SITE_OTHER): Payer: Medicare Other | Admitting: Family Medicine

## 2020-04-09 VITALS — BP 126/70 | HR 77 | Temp 97.4°F | Wt 219.5 lb

## 2020-04-09 DIAGNOSIS — M542 Cervicalgia: Secondary | ICD-10-CM

## 2020-04-09 DIAGNOSIS — R200 Anesthesia of skin: Secondary | ICD-10-CM

## 2020-04-09 MED ORDER — PREDNISONE 20 MG PO TABS: ORAL_TABLET | ORAL | 0 refills | Status: AC

## 2020-04-09 NOTE — Assessment & Plan Note (Signed)
Suspect some cervical radiculopathy with tingling but strength intact which is reassuring. PT referral and given her pain will try a second course of steroids to see if that improves her symptoms. If not better in 4-6 weeks will get imaging.

## 2020-04-09 NOTE — Progress Notes (Signed)
Subjective:     Karen Singh is a 64 y.o. female presenting for Neck Pain (all L side x 1 month with some relief from prednisone), Arm Pain (w tingling), and Hand Pain (w tingling )     HPI   #neck pain - covid vaccine the end of October and had an achy arm for a few days - 11/14 was when symptoms started - saw Dr. Ermalene Searing 11/19 and was given prednisone  - symptoms improved on the steroid but they are back to where they were before - still getting tingling in the hand and arm - no pain in arm or wrist/hand - endorses neck pain - tingling is constant - took ibuprofen this morning for the pain with some improvement  - endorses neck stiffness    Review of Systems   Social History   Tobacco Use  Smoking Status Never Smoker  Smokeless Tobacco Never Used        Objective:    BP Readings from Last 3 Encounters:  04/09/20 126/70  03/22/20 130/72  01/22/20 120/70   Wt Readings from Last 3 Encounters:  04/09/20 219 lb 8 oz (99.6 kg)  03/22/20 220 lb 12 oz (100.1 kg)  01/22/20 219 lb 12 oz (99.7 kg)    BP 126/70   Pulse 77   Temp (!) 97.4 F (36.3 C) (Temporal)   Wt 219 lb 8 oz (99.6 kg)   SpO2 100%   BMI 35.97 kg/m    Physical Exam Constitutional:      General: She is not in acute distress.    Appearance: She is well-developed. She is not diaphoretic.  HENT:     Right Ear: External ear normal.     Left Ear: External ear normal.     Nose: Nose normal.  Eyes:     Conjunctiva/sclera: Conjunctivae normal.  Neck:     Comments: Spurling positive on left Cardiovascular:     Rate and Rhythm: Normal rate.  Pulmonary:     Effort: Pulmonary effort is normal.  Musculoskeletal:     Cervical back: Normal range of motion and neck supple. Muscular tenderness present. No spinous process tenderness.     Comments: Normal UE strength. Though grip strength slightly diminished b/l  Skin:    General: Skin is warm and dry.     Capillary Refill: Capillary refill takes  less than 2 seconds.  Neurological:     Mental Status: She is alert. Mental status is at baseline.  Psychiatric:        Mood and Affect: Mood normal.        Behavior: Behavior normal.           Assessment & Plan:   Problem List Items Addressed This Visit      Other   Arm numbness left - Primary   Relevant Medications   predniSONE (DELTASONE) 20 MG tablet   Other Relevant Orders   Ambulatory referral to Physical Therapy   Neck pain    Suspect some cervical radiculopathy with tingling but strength intact which is reassuring. PT referral and given her pain will try a second course of steroids to see if that improves her symptoms. If not better in 4-6 weeks will get imaging.       Relevant Medications   predniSONE (DELTASONE) 20 MG tablet   Other Relevant Orders   Ambulatory referral to Physical Therapy       Return in about 4 weeks (around 05/07/2020).  Lynnda Child,  MD  This visit occurred during the SARS-CoV-2 public health emergency.  Safety protocols were in place, including screening questions prior to the visit, additional usage of staff PPE, and extensive cleaning of exam room while observing appropriate contact time as indicated for disinfecting solutions.

## 2020-04-09 NOTE — Patient Instructions (Signed)
Neck pain - Steroids - avoid NSAIDs while taking - Physical therapy - if not improving return in 4-6 weeks and we can reassess and plan for imaging

## 2020-08-27 ENCOUNTER — Ambulatory Visit (INDEPENDENT_AMBULATORY_CARE_PROVIDER_SITE_OTHER): Payer: Medicare Other | Admitting: Family Medicine

## 2020-08-27 ENCOUNTER — Other Ambulatory Visit: Payer: Self-pay

## 2020-08-27 ENCOUNTER — Ambulatory Visit (INDEPENDENT_AMBULATORY_CARE_PROVIDER_SITE_OTHER)
Admission: RE | Admit: 2020-08-27 | Discharge: 2020-08-27 | Disposition: A | Discharge status: Home / Self Care | Payer: Medicare Other | Source: Ambulatory Visit | Attending: Family Medicine | Admitting: Family Medicine

## 2020-08-27 VITALS — BP 120/70 | HR 85 | Temp 97.3°F | Ht 66.0 in | Wt 222.5 lb

## 2020-08-27 DIAGNOSIS — M25562 Pain in left knee: Secondary | ICD-10-CM | POA: Diagnosis not present

## 2020-08-27 DIAGNOSIS — G8929 Other chronic pain: Secondary | ICD-10-CM

## 2020-08-27 DIAGNOSIS — M542 Cervicalgia: Secondary | ICD-10-CM

## 2020-08-27 DIAGNOSIS — G5602 Carpal tunnel syndrome, left upper limb: Secondary | ICD-10-CM | POA: Diagnosis not present

## 2020-08-27 MED ORDER — MELOXICAM 15 MG PO TABS: 1.0000 | ORAL_TABLET | Freq: Every day | ORAL | 2 refills | Status: AC

## 2020-08-27 NOTE — Assessment & Plan Note (Signed)
Previously with hold hand numbness thought to be from neck, but now isolated tingling of digits 1-3 and positive carpal tunnel signs. Start brace at nighttime, include daytime if persisting. Discussed could take several weeks to resolve but improving no additional treatment. Meloxicam prn

## 2020-08-27 NOTE — Patient Instructions (Addendum)
Carpal tunnel on the left side - wear a brace at night - continue pain relievers - meloxicam as needed  Neck pain - X-ray today - will check back about MRI in 2 weeks - via [employer]

## 2020-08-27 NOTE — Assessment & Plan Note (Signed)
Chronic for pt. Working with PT again. No time for exam today but know arthritis. Advised f/u with me or Dr. Patsy Lager if persisting.

## 2020-08-27 NOTE — Assessment & Plan Note (Addendum)
Persistent pain through 3+ months of PT though overall improving. Numbness of the entire hand has improve and arm symptoms improve. Spurling negative today. Discussed continued PT and meloxicam refilled. Will get XR given duration of symptoms and f/u radiology read. Discussed that as neurological symptoms are improving would consider MRI if persisting but reassured overall that PT has been helping.

## 2020-08-27 NOTE — Progress Notes (Signed)
Subjective:     Karen Singh is a 65 y.o. female presenting for Neck Pain (L side ) and Tingling (L hand )     HPI  #Neck pain - arm tingling is improving - but still having finger tingling in the left hand - neck continues to be about the same - is going to physical therapy and working on neck/hand/knee  #hand pain - started the entire hand - with rehab - now just 1-3rd digit - no weakness - some pain  #Knee pain - is having knee and thigh pain - PT working with her on this - using voltaren gel as well  Review of Systems  04/09/2020: Clinic - Neck/arm pain - physical therapy and prednisone (2nd course)  Social History   Tobacco Use  Smoking Status Never Smoker  Smokeless Tobacco Never Used        Objective:    BP Readings from Last 3 Encounters:  08/27/20 120/70  04/09/20 126/70  03/22/20 130/72   Wt Readings from Last 3 Encounters:  08/27/20 222 lb 8 oz (100.9 kg)  04/09/20 219 lb 8 oz (99.6 kg)  03/22/20 220 lb 12 oz (100.1 kg)    BP 120/70   Pulse 85   Temp (!) 97.3 F (36.3 C) (Temporal)   Ht 5\' 6"  (1.676 m)   Wt 222 lb 8 oz (100.9 kg)   SpO2 99%   BMI 35.91 kg/m    Physical Exam Constitutional:      General: She is not in acute distress.    Appearance: She is well-developed. She is not diaphoretic.  HENT:     Right Ear: External ear normal.     Left Ear: External ear normal.  Eyes:     Conjunctiva/sclera: Conjunctivae normal.  Neck:     Comments: spurling negative b/l.  Cardiovascular:     Rate and Rhythm: Normal rate.  Pulmonary:     Effort: Pulmonary effort is normal.  Musculoskeletal:     Cervical back: Normal range of motion and neck supple.     Comments: Left hand:  Inspection: no swelling Palpation: no ttp ROM: normal  Strength: normal extension/flexion, finger strength, slightly diminished grip compared to the right +Phallen and + tinel with tingling in digits 1-3  Skin:    General: Skin is warm and dry.      Capillary Refill: Capillary refill takes less than 2 seconds.  Neurological:     Mental Status: She is alert. Mental status is at baseline.  Psychiatric:        Mood and Affect: Mood normal.        Behavior: Behavior normal.           Assessment & Plan:   Problem List Items Addressed This Visit      Nervous and Auditory   Acute carpal tunnel syndrome, left    Previously with hold hand numbness thought to be from neck, but now isolated tingling of digits 1-3 and positive carpal tunnel signs. Start brace at nighttime, include daytime if persisting. Discussed could take several weeks to resolve but improving no additional treatment. Meloxicam prn      Relevant Medications   meloxicam (MOBIC) 15 MG tablet     Other   Chronic pain of left knee    Chronic for pt. Working with PT again. No time for exam today but know arthritis. Advised f/u with me or Dr. if persisting.       Relevant Medications  meloxicam (MOBIC) 15 MG tablet   Neck pain - Primary    Persistent pain through 3+ months of PT though overall improving. Numbness of the entire hand has improve and arm symptoms improve. Spurling negative today. Discussed continued PT and meloxicam refilled. Will get XR given duration of symptoms and f/u radiology read. Discussed that as neurological symptoms are improving would consider MRI if persisting but reassured overall that PT has been helping.       Relevant Medications   meloxicam (MOBIC) 15 MG tablet   Other Relevant Orders   DG Cervical Spine Complete       Return if symptoms worsen or fail to improve.  Lynnda Child, MD  This visit occurred during the SARS-CoV-2 public health emergency.  Safety protocols were in place, including screening questions prior to the visit, additional usage of staff PPE, and extensive cleaning of exam room while observing appropriate contact time as indicated for disinfecting solutions.

## 2020-09-10 ENCOUNTER — Encounter: Payer: Self-pay | Admitting: Family Medicine

## 2020-09-10 DIAGNOSIS — M542 Cervicalgia: Secondary | ICD-10-CM

## 2020-09-10 DIAGNOSIS — R200 Anesthesia of skin: Secondary | ICD-10-CM

## 2020-10-15 ENCOUNTER — Other Ambulatory Visit: Payer: Self-pay | Admitting: Neurosurgery

## 2020-10-15 DIAGNOSIS — M4722 Other spondylosis with radiculopathy, cervical region: Secondary | ICD-10-CM

## 2020-10-23 LAB — RESULTS CONSOLE HPV: CHL HPV: NEGATIVE

## 2020-10-23 LAB — HM PAP SMEAR

## 2020-10-28 ENCOUNTER — Other Ambulatory Visit: Payer: Medicare Other

## 2020-10-30 ENCOUNTER — Ambulatory Visit
Admission: RE | Admit: 2020-10-30 | Discharge: 2020-10-30 | Disposition: A | Discharge status: Home / Self Care | Payer: Medicare Other | Source: Ambulatory Visit | Attending: Neurosurgery | Admitting: Neurosurgery

## 2020-10-30 DIAGNOSIS — M4722 Other spondylosis with radiculopathy, cervical region: Secondary | ICD-10-CM

## 2020-11-07 ENCOUNTER — Encounter: Payer: Self-pay | Admitting: Family Medicine

## 2020-11-10 ENCOUNTER — Ambulatory Visit
Admission: RE | Admit: 2020-11-10 | Discharge: 2020-11-10 | Disposition: A | Discharge status: Home / Self Care | Payer: Medicare Other | Source: Ambulatory Visit | Attending: Neurosurgery | Admitting: Neurosurgery

## 2020-11-10 ENCOUNTER — Other Ambulatory Visit: Payer: Self-pay

## 2020-12-07 ENCOUNTER — Other Ambulatory Visit: Payer: Self-pay | Admitting: Family Medicine

## 2020-12-07 DIAGNOSIS — M542 Cervicalgia: Secondary | ICD-10-CM

## 2020-12-07 DIAGNOSIS — G5602 Carpal tunnel syndrome, left upper limb: Secondary | ICD-10-CM

## 2021-08-18 IMAGING — MR MR CERVICAL SPINE W/O CM
4 of 5 series · 29 of 48 positions shown · non-contrast
Comparison: Radiograph from 08/27/2020.

CLINICAL DATA: Initial evaluation for neck soreness, numbness and
tingling in left upper extremity and hand. Spondylosis with
radiculopathy.

EXAM:
MRI CERVICAL SPINE WITHOUT CONTRAST
TECHNIQUE: Multiplanar, multisequence MR imaging of the cervical spine was
performed. No intravenous contrast was administered.

[Series 3: T2 · sagittal · 3.0mm · 0.66mm/px · 8 of 18 slices shown (1 of 2)]
[im 1/18]
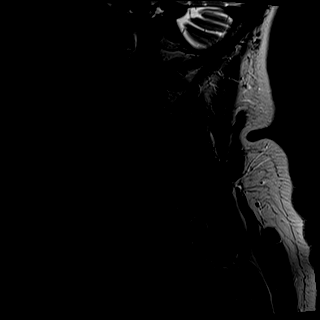
[im 3/18]
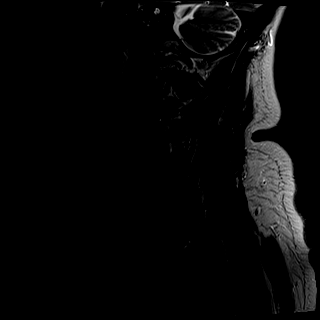
[im 5/18]
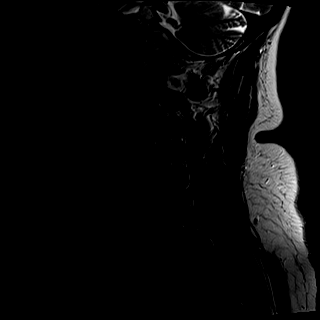
[im 8/18]
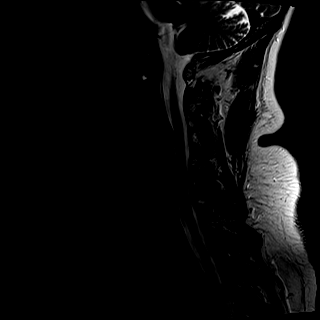
[im 10/18]
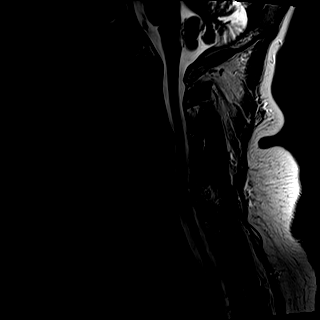
[im 13/18]
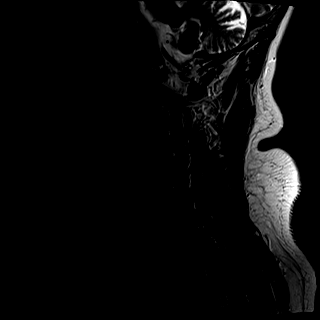
[im 15/18]
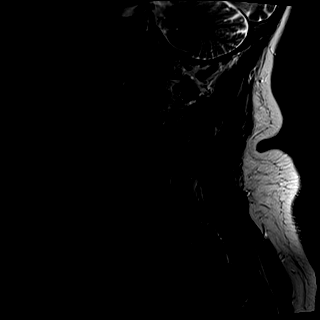
[im 18/18]
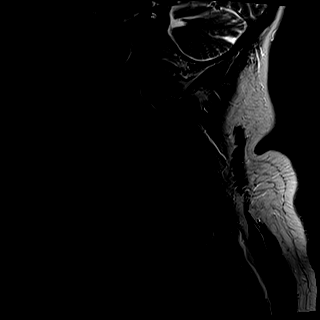

[Series 4: T1 · sagittal · 3.0mm · 0.41mm/px · 8 of 18 slices shown]
[im 1/18]
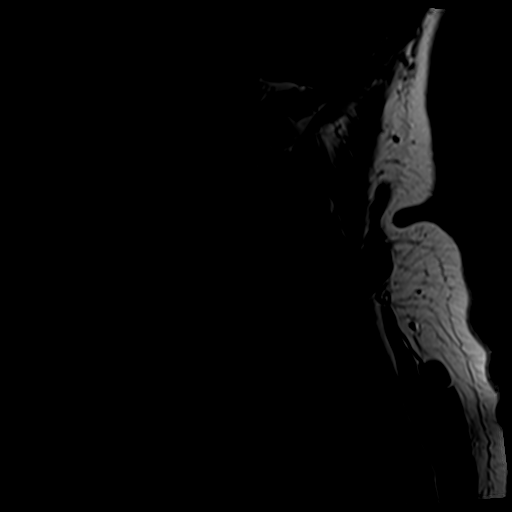
[im 3/18]
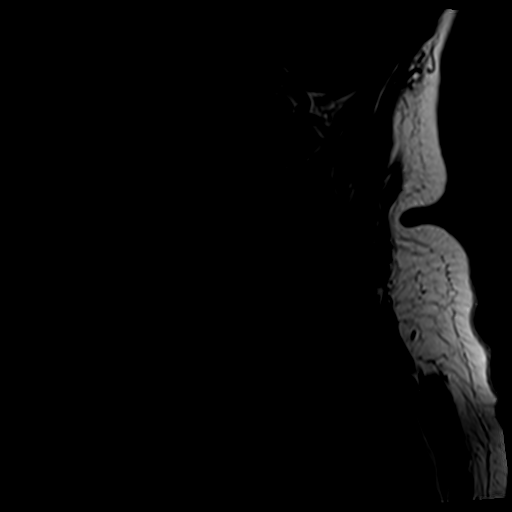
[im 5/18]
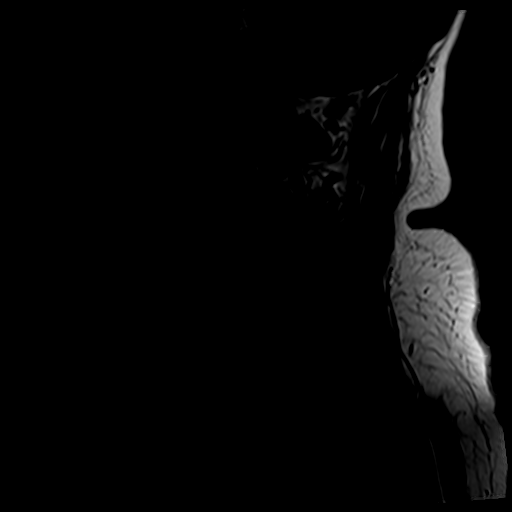
[im 8/18]
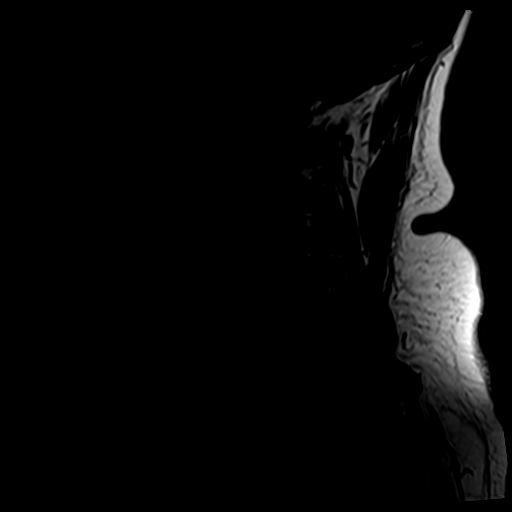
[im 10/18]
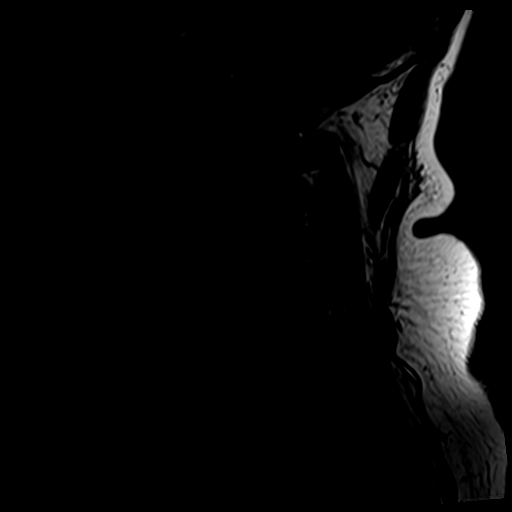
[im 13/18]
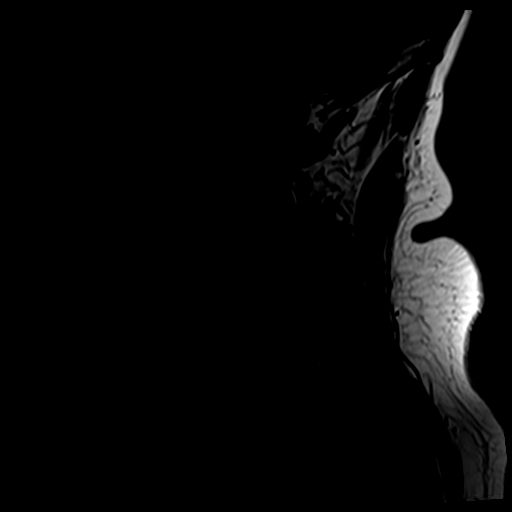
[im 15/18]
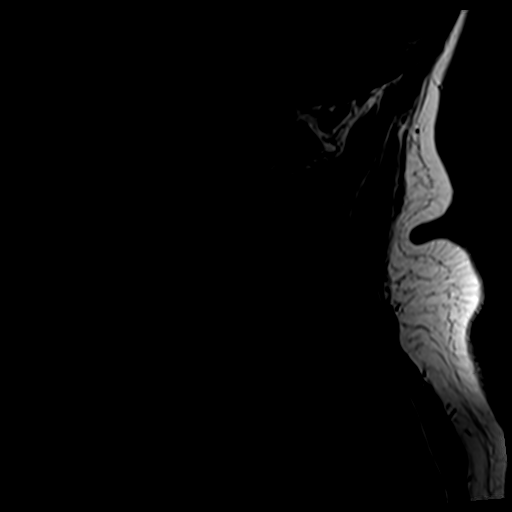
[im 18/18]
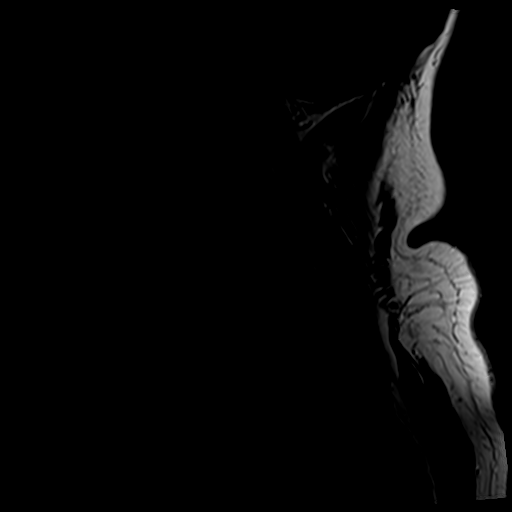

[Series 5: tir sag · sagittal · 3.0mm · 0.41mm/px · 4 of 18 slices shown]
[im 1/18]
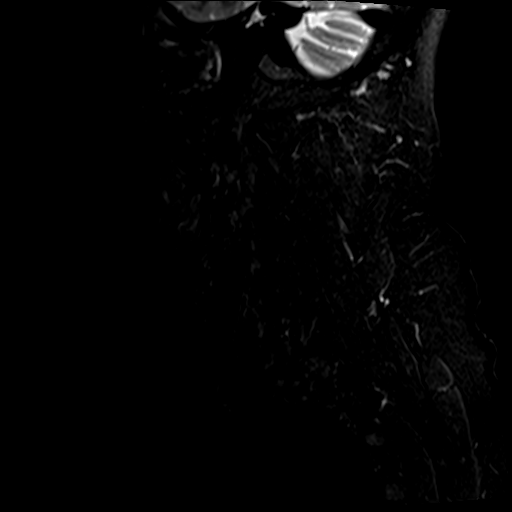
[im 3/18]
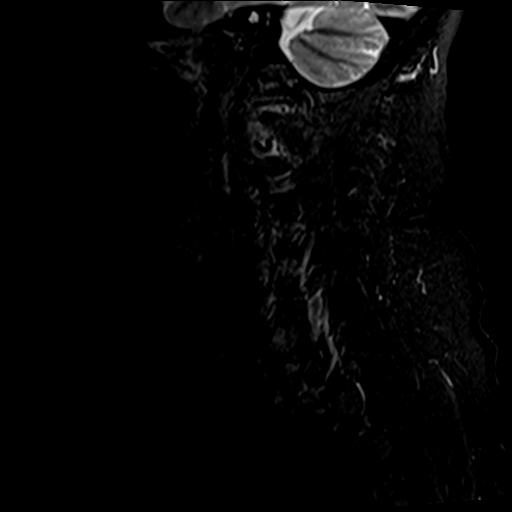
[im 10/18]
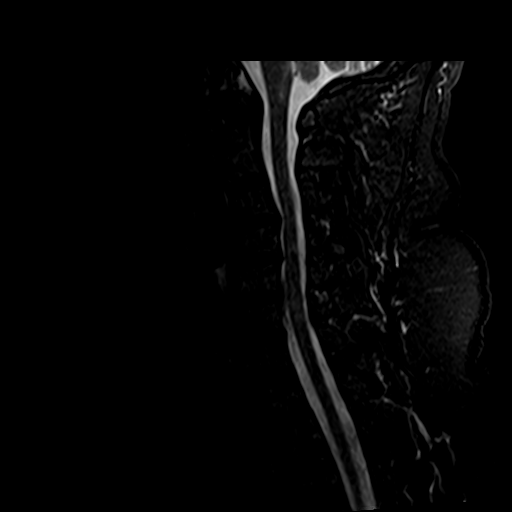
[im 15/18]
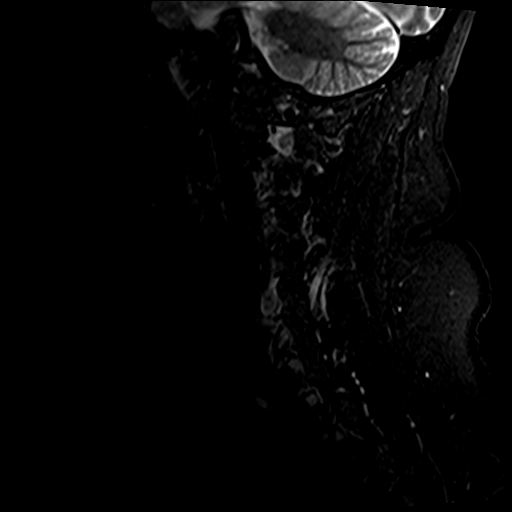

[Series 7: T2 · axial · 3.0mm · 0.70mm/px · z∈[-34,+57]mm · 9 of 27 slices shown (2 of 2)]
[im 1/27]
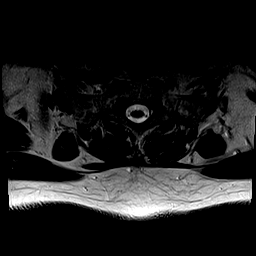
[im 5/27]
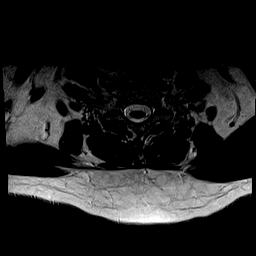
[im 8/27]
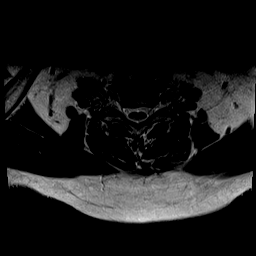
[im 12/27]
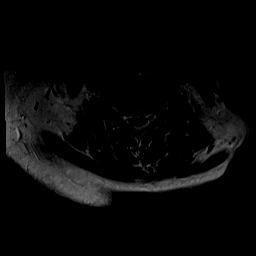
[im 15/27]
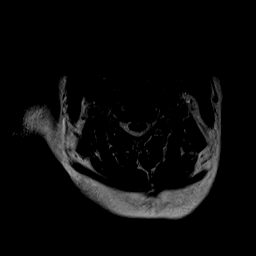
[im 19/27]
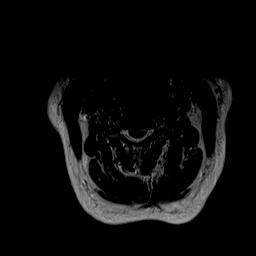
[im 22/27]
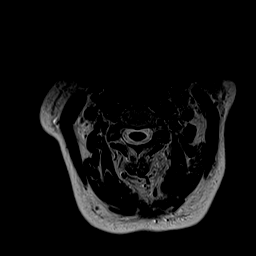
[im 24/27]
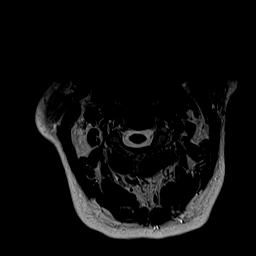
[im 27/27]
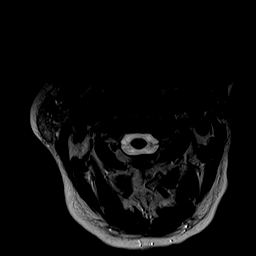

[29 of 48 positions shown; findings below may reference images not displayed]

FINDINGS: Alignment: Reversal of the normal cervical lordosis with apex at
C3-4. No listhesis.

Vertebrae: Vertebral body height maintained without fracture. Bone
marrow signal intensity normal. No discrete or worrisome osseous
lesions. Mild discogenic reactive endplate change present about the
C3-4 interspace. No abnormal marrow edema.

Cord: Normal signal and morphology.

Posterior Fossa, vertebral arteries, paraspinal tissues: Visualized
brain and posterior fossa within normal limits. Craniocervical
junction normal. Paraspinous and prevertebral soft tissues within
normal limits. Normal intravascular flow voids seen within the
vertebral arteries bilaterally.

Disc levels:

C2-C3: Unremarkable.

C3-C4: Degenerative intervertebral disc space narrowing with diffuse
disc osteophyte complex. Flattening of the ventral thecal sac with
mild flattening of the ventral cord. No cord signal changes. Mild
spinal stenosis. Moderate bilateral C4 foraminal narrowing.

C4-C5: Mild disc bulge with uncinate spurring. Flattening and
effacement of the ventral thecal sac with mild spinal stenosis. Mild
to moderate bilateral C5 foraminal stenosis.

C5-C6: Diffuse disc osteophyte complex with bilateral uncovertebral
spurring. Superimposed right subarticular disc protrusion contacts
the right ventral cord (series 6, image 17). Mild flattening of the
right hemi cord with mild spinal stenosis. Moderate right worse than
left C6 foraminal narrowing.

C6-C7: Diffuse disc osteophyte complex, slightly eccentric to the
right. Mild flattening of the ventral thecal sac, also worse on the
right, but no significant spinal stenosis. Foramina remain patent.

C7-T1:  Unremarkable.

Visualized upper thoracic spine demonstrates no significant finding.
IMPRESSION: 1. Multilevel cervical spondylosis with resultant mild spinal
stenosis at C3-4 through C5-6.
2. Right subarticular disc protrusion at C5-6 with secondary mild
flattening of the right hemi cord.
3. Multifactorial degenerative changes with resultant multilevel
foraminal narrowing as above. Notable findings include moderate
bilateral C4 and C6 foraminal stenosis, with mild to moderate
bilateral C5 foraminal narrowing.

## 2022-05-11 ENCOUNTER — Ambulatory Visit
Admission: EM | Admit: 2022-05-11 | Discharge: 2022-05-11 | Disposition: A | Discharge status: Home / Self Care | Payer: Medicare Other | Attending: Emergency Medicine | Admitting: Emergency Medicine

## 2022-05-11 DIAGNOSIS — Z79890 Hormone replacement therapy (postmenopausal): Secondary | ICD-10-CM | POA: Insufficient documentation

## 2022-05-11 DIAGNOSIS — Z1152 Encounter for screening for COVID-19: Secondary | ICD-10-CM | POA: Diagnosis not present

## 2022-05-11 DIAGNOSIS — R051 Acute cough: Secondary | ICD-10-CM | POA: Diagnosis present

## 2022-05-11 DIAGNOSIS — H6692 Otitis media, unspecified, left ear: Secondary | ICD-10-CM | POA: Diagnosis present

## 2022-05-11 DIAGNOSIS — E785 Hyperlipidemia, unspecified: Secondary | ICD-10-CM | POA: Insufficient documentation

## 2022-05-11 DIAGNOSIS — J0100 Acute maxillary sinusitis, unspecified: Secondary | ICD-10-CM | POA: Diagnosis present

## 2022-05-11 MED ORDER — AMOXICILLIN 875 MG PO TABS: 875.0000 mg | ORAL_TABLET | Freq: Two times a day (BID) | ORAL | 0 refills | Status: AC

## 2022-05-11 MED ORDER — BENZONATATE 100 MG PO CAPS: 100.0000 mg | ORAL_CAPSULE | Freq: Three times a day (TID) | ORAL | 0 refills | Status: AC | PRN

## 2022-05-11 NOTE — ED Triage Notes (Signed)
Patient to Urgent Care with complaints of dry cough, post nasal drip, nasal drainage, sore throat and ear fullness/ pain x3 days.  Fever Saturday/ Sunday, max temp 100.7.  Has been taking advil.

## 2022-05-11 NOTE — ED Provider Notes (Signed)
[name]    CSN: [id number] Arrival date & time: 05/11/22  1236      History   Chief Complaint Chief Complaint  Patient presents with   Cough    HPI Karen HUNEKE is a 67 y.o. female.  Patient presents with 3 day history of low grade fever, ear pain, sore throat, congestion, cough.  Tmax 100.7.  Treatment at home with Advil; last taken 0400.  She denies chest pain, shortness of breath, vomiting, diarrhea, or other symptoms.  Her medical history includes hyperlipidemia.    The history is provided by the patient and medical records.    Past Medical History:  Diagnosis Date   Anxiety     Patient Active Problem List   Diagnosis Date Noted   Acute carpal tunnel syndrome, left 08/27/2020   Neck pain 04/09/2020   Local reaction to COVID-19 vaccine 03/22/2020   Arm numbness left 03/22/2020   Chronic pain of left knee 01/22/2020   Acid reflux 01/22/2020   Hyperlipidemia 12/19/2019   Tinea pedis of left foot 12/19/2019   Obesity (BMI 35.0-39.9 without comorbidity) 12/19/2019   Post-menopause on HRT (hormone replacement therapy) 02/14/2014   Murmur, cardiac 02/14/2014    Past Surgical History:  Procedure Laterality Date   ECTOPIC PREGNANCY SURGERY  1990   EYE SURGERY     Cataract   FOOT SURGERY     TONSILLECTOMY AND ADENOIDECTOMY  1975   TRIGGER FINGER RELEASE  11/27/2011   Procedure: MINOR RELEASE TRIGGER FINGER/A-1 PULLEY;  Surgeon: Wyn Forster., MD;  Location: [facility];  Service: Orthopedics;  Laterality: Right;  right long    OB History   No obstetric history on file.      Home Medications    Prior to Admission medications   Medication Sig Start Date End Date Taking? Authorizing Provider  amoxicillin (AMOXIL) 875 MG tablet Take 1 tablet (875 mg total) by mouth 2 (two) times daily for 10 days. 05/11/22 05/21/22 Yes Mickie Bail, NP  benzonatate (TESSALON) 100 MG capsule Take 1 capsule (100 mg total) by mouth 3 (three) times daily  as needed for cough. 05/11/22  Yes Mickie Bail, NP  estradiol (VIVELLE-DOT) 0.075 MG/24HR Place 0.0375 patches onto the skin 2 (two) times a week.  03/15/20   [provider]  ibuprofen (ADVIL,MOTRIN) 200 MG tablet Take 400 mg by mouth every 6 (six) hours as needed for pain.    [provider]  meloxicam (MOBIC) 15 MG tablet Take 1 tablet (15 mg total) by mouth daily. 08/27/20   Gweneth Dimitri, MD  progesterone (PROMETRIUM) 200 MG capsule Take 200 mg by mouth. Take 1 tab the first ten days of every other month    [provider]  terbinafine (LAMISIL) 1 % cream Apply 1 application topically 2 (two) times daily. 12/19/19   Gweneth Dimitri, MD    Family History Family History  Problem Relation Age of Onset   Hyperlipidemia Mother    Multiple myeloma Mother    Diabetes Father    Prostate cancer Father    Heart disease Father    Breast cancer Sister 67    Social History Social History   Tobacco Use   Smoking status: Never   Smokeless tobacco: Never  Vaping Use   Vaping Use: Never used  Substance Use Topics   Alcohol use: No   Drug use: No     Allergies   Patient has no known allergies.   Review  of Systems Review of Systems  Constitutional:  Positive for fever. Negative for chills.  HENT:  Positive for congestion, ear pain, postnasal drip, rhinorrhea and sore throat.   Respiratory:  Positive for cough. Negative for shortness of breath.   Cardiovascular:  Negative for chest pain and palpitations.  Gastrointestinal:  Negative for diarrhea and vomiting.  Skin:  Negative for color change and rash.  All other systems reviewed and are negative.    Physical Exam Triage Vital Signs ED Triage Vitals  Enc Vitals Group     BP 05/11/22 1316 131/83     Pulse Rate 05/11/22 1312 (!) 105     Resp 05/11/22 1312 18     Temp 05/11/22 1312 98.6 F (37 C)     Temp src --      SpO2 05/11/22 1312 98 %     Weight 05/11/22 1314 200 lb (90.7 kg)     Height 05/11/22  1314 5\' 7"  (1.702 m)     Head Circumference --      Peak Flow --      Pain Score 05/11/22 1314 7     Pain Loc --      Pain Edu? --      Excl. in GC? --    No data found.  Updated Vital Signs BP 131/83   Pulse (!) 105   Temp 98.6 F (37 C)   Resp 18   Ht 5\' 7"  (1.702 m)   Wt 200 lb (90.7 kg)   SpO2 98%   BMI 31.32 kg/m   Visual Acuity Right Eye Distance:   Left Eye Distance:   Bilateral Distance:    Right Eye Near:   Left Eye Near:    Bilateral Near:     Physical Exam Vitals and nursing note reviewed.  Constitutional:      General: She is not in acute distress.    Appearance: She is well-developed. She is not ill-appearing.  HENT:     Right Ear: Tympanic membrane normal.     Left Ear: Tympanic membrane is erythematous.     Nose: Congestion and rhinorrhea present.     Mouth/Throat:     Mouth: Mucous membranes are moist.     Pharynx: Oropharynx is clear.  Cardiovascular:     Rate and Rhythm: Normal rate and regular rhythm.     Heart sounds: Normal heart sounds.  Pulmonary:     Effort: Pulmonary effort is normal. No respiratory distress.     Breath sounds: Normal breath sounds.  Musculoskeletal:     Cervical back: Neck supple.  Skin:    General: Skin is warm and dry.  Neurological:     Mental Status: She is alert.  Psychiatric:        Mood and Affect: Mood normal.        Behavior: Behavior normal.      UC Treatments / Results  Labs (all labs ordered are listed, but only abnormal results are displayed) Labs Reviewed  SARS CORONAVIRUS 2 (TAT 6-24 HRS)    EKG   Radiology No results found.  Procedures Procedures (including critical care time)  Medications Ordered in UC Medications - No data to display  Initial Impression / Assessment and Plan / UC Course  I have reviewed the triage vital signs and the nursing notes.  Pertinent labs & imaging results that were available during my care of the patient were reviewed by me and considered in my  medical decision making (see chart for  details).    Left otitis media, acute sinusitis, cough.  Per patient request, COVID pending.  If COVID positive, would meet criteria for treatment with molnupiravir based on her age.  Treating ear infection today with amoxicillin and cough with Tessalon Perles.  Discussed symptomatic treatment including Tylenol, rest, hydration.  Instructed patient to follow up with her PCP if symptoms are not improving.  She agrees to plan of care.   Final Clinical Impressions(s) / UC Diagnoses   Final diagnoses:  Left otitis media, unspecified otitis media type  Acute non-recurrent maxillary sinusitis  Acute cough     Discharge Instructions      Take the amoxicillin and Tessalon Perles as directed.  Follow up with your primary care provider if your symptoms are not improving.        ED Prescriptions     Medication Sig Dispense Auth. Provider   benzonatate (TESSALON) 100 MG capsule Take 1 capsule (100 mg total) by mouth 3 (three) times daily as needed for cough. 21 capsule Sharion Balloon, NP   amoxicillin (AMOXIL) 875 MG tablet Take 1 tablet (875 mg total) by mouth 2 (two) times daily for 10 days. 20 tablet Sharion Balloon, NP      PDMP not reviewed this encounter.   Sharion Balloon, NP 05/11/22 [phone]

## 2022-05-11 NOTE — Discharge Instructions (Addendum)
Take the amoxicillin and Tessalon Perles as directed.    Follow up with your primary care provider if your symptoms are not improving.    

## 2022-05-12 ENCOUNTER — Telehealth (HOSPITAL_COMMUNITY): Payer: Self-pay | Admitting: Emergency Medicine

## 2022-05-12 ENCOUNTER — Telehealth: Payer: Self-pay

## 2022-05-12 LAB — SARS CORONAVIRUS 2 (TAT 6-24 HRS): SARS Coronavirus 2: POSITIVE — AB

## 2022-05-12 MED ORDER — MOLNUPIRAVIR EUA 200MG CAPSULE: 4.0000 | ORAL_CAPSULE | Freq: Two times a day (BID) | ORAL | 0 refills | Status: AC

## 2022-05-12 NOTE — Telephone Encounter (Signed)
Patient contacted Urgent Care to request Covid medications. States that she was informed she would be called in medication if her Covid PCR was positive during her visit yesterday 05/11/2022. Positive Covid test available in patient's chart.   Requests medication be sent to [facility] in [place].   Patient advised that Molnupiravir has already been sent in by our call back nurse.

## 2023-07-22 ENCOUNTER — Ambulatory Visit
Admission: EM | Admit: 2023-07-22 | Discharge: 2023-07-22 | Disposition: A | Discharge status: Home / Self Care | Attending: Emergency Medicine | Admitting: Emergency Medicine

## 2023-07-22 ENCOUNTER — Other Ambulatory Visit: Payer: Self-pay

## 2023-07-22 ENCOUNTER — Encounter: Payer: Self-pay | Admitting: Emergency Medicine

## 2023-07-22 DIAGNOSIS — M545 Low back pain, unspecified: Secondary | ICD-10-CM

## 2023-07-22 DIAGNOSIS — M25561 Pain in right knee: Secondary | ICD-10-CM

## 2023-07-22 MED ORDER — KETOROLAC TROMETHAMINE 30 MG/ML IJ SOLN
30.0000 mg | Freq: Once | INTRAMUSCULAR | Status: AC
  Administered 2023-07-22: 30 mg via INTRAMUSCULAR

## 2023-07-22 MED ORDER — CYCLOBENZAPRINE HCL 10 MG PO TABS: 10.0000 mg | ORAL_TABLET | Freq: Every day | ORAL | 0 refills | Status: AC

## 2023-07-22 MED ORDER — PREDNISONE 10 MG (21) PO TBPK: ORAL_TABLET | Freq: Every day | ORAL | 0 refills | Status: DC

## 2023-07-22 NOTE — ED Provider Notes (Signed)
 Karen Singh    CSN: [id number] Arrival date & time: 07/22/23  1314      History   Chief Complaint Chief Complaint  Patient presents with   Back Pain    HPI Karen Singh is a 68 y.o. female.   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  Pain has been constant, pain from the back radiates into the lower extremity, denies presence of numbness or tingling.  Exacerbated by all movement.  Able to bear weight to the lower extremity and able to complete range of motion without eliciting pain.  Has been using lidocaine patches, Salonpas and heat which has been helpful but symptoms have persisted.  Past Medical History:  Diagnosis Date   Anxiety     Patient Active Problem List   Diagnosis Date Noted   Acute carpal tunnel syndrome, left 08/27/2020   Neck pain 04/09/2020   Local reaction to COVID-19 vaccine 03/22/2020   Arm numbness left 03/22/2020   Chronic pain of left knee 01/22/2020   Acid reflux 01/22/2020   Hyperlipidemia 12/19/2019   Tinea pedis of left foot 12/19/2019   Obesity (BMI 35.0-39.9 without comorbidity) 12/19/2019   Post-menopause on HRT (hormone replacement therapy) 02/14/2014   Murmur, cardiac 02/14/2014    Past Surgical History:  Procedure Laterality Date   ECTOPIC PREGNANCY SURGERY  1990   EYE SURGERY     Cataract   FOOT SURGERY     TONSILLECTOMY AND ADENOIDECTOMY  1975   TRIGGER FINGER RELEASE  11/27/2011   Procedure: MINOR RELEASE TRIGGER FINGER/A-1 PULLEY;  Surgeon: Wyn Forster., MD;  Location: [facility];  Service: Orthopedics;  Laterality: Right;  right long    OB History   No obstetric history on file.      Home Medications    Prior to Admission medications   Medication Sig Start Date End Date Taking? Authorizing Provider  cyclobenzaprine (FLEXERIL) 10 MG tablet Take 1 tablet (10 mg total) by mouth at  bedtime. 07/22/23  Yes Kamden Reber R, NP  predniSONE (STERAPRED UNI-PAK 21 TAB) 10 MG (21) TBPK tablet Take by mouth daily. Take 6 tabs by mouth daily  for 1 days, then 5 tabs for 1 days, then 4 tabs for 1 days, then 3 tabs for 1 days, 2 tabs for 1 days, then 1 tab by mouth daily for 1 days 07/22/23  Yes Mycheal Veldhuizen R, NP  benzonatate (TESSALON) 100 MG capsule Take 1 capsule (100 mg total) by mouth 3 (three) times daily as needed for cough. 05/11/22   Mickie Bail, NP  estradiol (VIVELLE-DOT) 0.075 MG/24HR Place 0.0375 patches onto the skin 2 (two) times a week.  03/15/20   [provider]  ibuprofen (ADVIL,MOTRIN) 200 MG tablet Take 400 mg by mouth every 6 (six) hours as needed for pain.    [provider]  meloxicam (MOBIC) 15 MG tablet Take 1 tablet (15 mg total) by mouth daily. 08/27/20   Gweneth Dimitri, MD  progesterone (PROMETRIUM) 200 MG capsule Take 200 mg by mouth. Take 1 tab the first ten days of every other month    [provider]  terbinafine (LAMISIL) 1 % cream Apply 1 application topically 2 (two) times daily. 12/19/19   Gweneth Dimitri, MD    Family History Family History  Problem Relation Age of Onset   Hyperlipidemia Mother    Multiple  myeloma Mother    Diabetes Father    Prostate cancer Father    Heart disease Father    Breast cancer Sister 33    Social History Social History   Tobacco Use   Smoking status: Never   Smokeless tobacco: Never  Vaping Use   Vaping status: Never Used  Substance Use Topics   Alcohol use: No   Drug use: No     Allergies   Patient has no known allergies.   Review of Systems Review of Systems   Physical Exam Triage Vital Signs ED Triage Vitals [07/22/23 1345]  Encounter Vitals Group     BP (!) 166/79     Systolic BP Percentile      Diastolic BP Percentile      Pulse Rate 74     Resp 18     Temp 99 F (37.2 C)     Temp Source Temporal     SpO2 97 %     Weight      Height      Head  Circumference      Peak Flow      Pain Score 5     Pain Loc      Pain Education      Exclude from Growth Chart    No data found.  Updated Vital Signs BP (!) 166/79 (BP Location: Left Arm)   Pulse 74   Temp 99 F (37.2 C) (Temporal)   Resp 18   SpO2 97%   Visual Acuity Right Eye Distance:   Left Eye Distance:   Bilateral Distance:    Right Eye Near:   Left Eye Near:    Bilateral Near:     Physical Exam Constitutional:      Appearance: Normal appearance.  Eyes:     Extraocular Movements: Extraocular movements intact.  Pulmonary:     Effort: Pulmonary effort is normal.  Musculoskeletal:     Comments: Tenderness present to the right lower latissimus dorsi without ecchymosis swelling or deformity, able to sit erect without complication, able to twist turn and bend but pain is elicited with movement  Tenderness present to the medial aspect of the left knee with mild swelling, no effusion noted, able to bear weight and able to complete range of motion, 2+ popliteal pulse  Neurological:     Mental Status: She is alert and oriented to person, place, and time. Mental status is at baseline.      UC Treatments / Results  Labs (all labs ordered are listed, but only abnormal results are displayed) Labs Reviewed - No data to display  EKG   Radiology No results found.  Procedures Procedures (including critical care time)  Medications Ordered in UC Medications  ketorolac (TORADOL) 30 MG/ML injection 30 mg (30 mg Intramuscular Given 07/22/23 1414)    Initial Impression / Assessment and Plan / UC Course  I have reviewed the triage vital signs and the nursing notes.  Pertinent labs & imaging results that were available during my care of the patient were reviewed by me and considered in my medical decision making (see chart for details).  Acute right-sided low back pain without sciatica, acute pain of right knee  Etiology most likely muscular low suspicion for spinal  involvement or saddle anesthesia therefore deferring imaging, Toradol IM given and prescribed prednisone and Flexeril for home use recommended RICE, heat massage stretching with activity as tolerated and walker referral given orthopedics Final Clinical Impressions(s) / UC Diagnoses  Final diagnoses:  Acute right-sided low back pain without sciatica  Acute pain of right knee     Discharge Instructions      Your pain is most likely caused by irritation to the muscles.  You may give an injection of Toradol to help reduce inflammation and help with pain, daily will start to see improvement within 30 minutes to an hour  Start tomorrow take prednisone every morning with food as directed to continue the above process, may use Tylenol or any topical medicines additionally  May use muscle relaxant as needed at bedtime for additional comfort  You may use heating pad in 15 minute intervals as needed for additional comfort, or you may find comfort in using ice in 10-15 minutes over affected area  Begin stretching affected area daily for 10 minutes as tolerated to further loosen muscles   When lying down place pillow underneath and between knees for support  Can try sleeping without pillow on firm mattress   Practice good posture: head back, shoulders back, chest forward, pelvis back and weight distributed evenly on both legs  If pain persist after recommended treatment or reoccurs if may be beneficial to follow up with orthopedic specialist for evaluation, this doctor specializes in the bones and can manage your symptoms long-term with options such as but not limited to imaging, medications or physical therapy      ED Prescriptions     Medication Sig Dispense Auth. Provider   predniSONE (STERAPRED UNI-PAK 21 TAB) 10 MG (21) TBPK tablet Take by mouth daily. Take 6 tabs by mouth daily  for 1 days, then 5 tabs for 1 days, then 4 tabs for 1 days, then 3 tabs for 1 days, 2 tabs for 1 days, then  1 tab by mouth daily for 1 days 21 tablet Salvator Seppala R, NP   cyclobenzaprine (FLEXERIL) 10 MG tablet Take 1 tablet (10 mg total) by mouth at bedtime. 10 tablet Valinda Hoar, NP      PDMP not reviewed this encounter.   Valinda Hoar, [place] 07/22/23 [phone]

## 2023-07-22 NOTE — Discharge Instructions (Addendum)
 Your pain is most likely caused by irritation to the muscles.  You may give an injection of Toradol to help reduce inflammation and help with pain, daily will start to see improvement within 30 minutes to an hour  Start tomorrow take prednisone every morning with food as directed to continue the above process, may use Tylenol or any topical medicines additionally  May use muscle relaxant as needed at bedtime for additional comfort  You may use heating pad in 15 minute intervals as needed for additional comfort, or you may find comfort in using ice in 10-15 minutes over affected area  Begin stretching affected area daily for 10 minutes as tolerated to further loosen muscles   When lying down place pillow underneath and between knees for support  Can try sleeping without pillow on firm mattress   Practice good posture: head back, shoulders back, chest forward, pelvis back and weight distributed evenly on both legs  If pain persist after recommended treatment or reoccurs if may be beneficial to follow up with orthopedic specialist for evaluation, this doctor specializes in the bones and can manage your symptoms long-term with options such as but not limited to imaging, medications or physical therapy

## 2023-07-22 NOTE — ED Triage Notes (Signed)
 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  Went to get up and felt an immediate pulling/catching sensation.  Pain every since.  Has not worsened, but not really improving.  Lidocaine patches, heat, and ibuprofen help some.

## 2024-04-26 ENCOUNTER — Encounter: Payer: Self-pay | Admitting: Emergency Medicine

## 2024-04-26 ENCOUNTER — Ambulatory Visit
Admission: EM | Admit: 2024-04-26 | Discharge: 2024-04-26 | Disposition: A | Discharge status: Home / Self Care | Attending: Emergency Medicine | Admitting: Emergency Medicine

## 2024-04-26 DIAGNOSIS — J0140 Acute pansinusitis, unspecified: Secondary | ICD-10-CM

## 2024-04-26 MED ORDER — PREDNISONE 10 MG (21) PO TBPK: ORAL_TABLET | Freq: Every day | ORAL | 0 refills | Status: AC

## 2024-04-26 MED ORDER — AMOXICILLIN-POT CLAVULANATE 875-125 MG PO TABS: 1.0000 | ORAL_TABLET | Freq: Two times a day (BID) | ORAL | 0 refills | Status: AC

## 2024-04-26 MED ORDER — IPRATROPIUM BROMIDE 0.03 % NA SOLN: 2.0000 | Freq: Two times a day (BID) | NASAL | 0 refills | Status: AC

## 2024-04-26 NOTE — Discharge Instructions (Signed)
 Today you are being treated for a sinus infection  Take Augmentin  twice daily for 7 days for treatment of bacteria  Begin prednisone  every morning with food as directed to help reduce sinus pressure  You may use nasal spray twice daily for additional comfort    You can take Tylenol  as needed for fever reduction and pain relief.   For cough: honey 1/2 to 1 teaspoon (you can dilute the honey in water or another fluid).  You can also use guaifenesin and dextromethorphan for cough. You can use a humidifier for chest congestion and cough.  If you don't have a humidifier, you can sit in the bathroom with the hot shower running.      For sore throat: try warm salt water gargles, cepacol lozenges, throat spray, warm tea or water with lemon/honey, popsicles or ice, or OTC cold relief medicine for throat discomfort.   For congestion: take a daily anti-histamine like Zyrtec, Claritin, and a oral decongestant, such as pseudoephedrine.  You can also use Flonase  1-2 sprays in each nostril daily.   It is important to stay hydrated: drink plenty of fluids (water, gatorade/powerade/pedialyte, juices, or teas) to keep your throat moisturized and help further relieve irritation/discomfort.

## 2024-04-26 NOTE — ED Triage Notes (Signed)
 Patient reports cough, runny nose headache left ear pain x 1 week. Patient has taken mucinex and Vicks 44 with no relief.  Rates headache pain 4/10 and left ear pain 4/10.

## 2024-04-26 NOTE — ED Provider Notes (Signed)
 " CAY RALPH PELT    CSN: [id number] Arrival date & time: 04/26/24  1140      History   Chief Complaint Chief Complaint  Patient presents with   Otalgia   Cough   Nasal Congestion   Headache    HPI CLARICE ZULAUF is a 68 y.o. female.   Patient presents for evaluation of subjective fever, chills and bodyaches, nasal congestion, left-sided ear fullness, sinus pressure to the left side of the forehead, sore throat, productive cough and intermittent headaches present for 7 days.  Cough worse at nighttime interfering with sleep.  Has attempted use of Vicks and Mucinex.  Denies shortness of breath or wheezing.  Tolerable to food and liquids.     Past Medical History:  Diagnosis Date   Anxiety     Patient Active Problem List   Diagnosis Date Noted   Acute carpal tunnel syndrome, left 08/27/2020   Neck pain 04/09/2020   Local reaction to COVID-19 vaccine 03/22/2020   Arm numbness left 03/22/2020   Chronic pain of left knee 01/22/2020   Acid reflux 01/22/2020   Hyperlipidemia 12/19/2019   Tinea pedis of left foot 12/19/2019   Obesity (BMI 35.0-39.9 without comorbidity) 12/19/2019   Post-menopause on HRT (hormone replacement therapy) 02/14/2014   Murmur, cardiac 02/14/2014    Past Surgical History:  Procedure Laterality Date   ECTOPIC PREGNANCY SURGERY  1990   EYE SURGERY     Cataract   FOOT SURGERY     TONSILLECTOMY AND ADENOIDECTOMY  1975   TRIGGER FINGER RELEASE  11/27/2011   Procedure: MINOR RELEASE TRIGGER FINGER/A-1 PULLEY;  Surgeon: Lamar LULLA Leonor Mickey., MD;  Location: [facility];  Service: Orthopedics;  Laterality: Right;  right long    OB History   No obstetric history on file.      Home Medications    Prior to Admission medications  Medication Sig Start Date End Date Taking? Authorizing Provider  benzonatate  (TESSALON ) 100 MG capsule Take 1 capsule (100 mg total) by mouth 3 (three) times daily as needed for cough. 05/11/22   Corlis Burnard DEL, NP  cyclobenzaprine  (FLEXERIL ) 10 MG tablet Take 1 tablet (10 mg total) by mouth at bedtime. 07/22/23   Zerick Prevette, Shelba SAUNDERS, NP  estradiol (VIVELLE-DOT) 0.075 MG/24HR Place 0.0375 patches onto the skin 2 (two) times a week.  03/15/20   [provider]  ibuprofen (ADVIL,MOTRIN) 200 MG tablet Take 400 mg by mouth every 6 (six) hours as needed for pain.    [provider]  meloxicam  (MOBIC ) 15 MG tablet Take 1 tablet (15 mg total) by mouth daily. 08/27/20   Velma Raisin, MD  predniSONE  (STERAPRED UNI-PAK 21 TAB) 10 MG (21) TBPK tablet Take by mouth daily. Take 6 tabs by mouth daily  for 1 days, then 5 tabs for 1 days, then 4 tabs for 1 days, then 3 tabs for 1 days, 2 tabs for 1 days, then 1 tab by mouth daily for 1 days 07/22/23   Teresa Shelba SAUNDERS, NP  progesterone (PROMETRIUM) 200 MG capsule Take 200 mg by mouth. Take 1 tab the first ten days of every other month    [provider]  terbinafine  (LAMISIL ) 1 % cream Apply 1 application topically 2 (two) times daily. 12/19/19   Velma Raisin, MD    Family History Family History  Problem Relation Age of Onset   Hyperlipidemia Mother    Multiple myeloma Mother    Diabetes Father  Prostate cancer Father    Heart disease Father    Breast cancer Sister 46    Social History Social History[1]   Allergies   Patient has no known allergies.   Review of Systems Review of Systems  Constitutional:  Positive for chills and fever. Negative for activity change, appetite change, diaphoresis, fatigue and unexpected weight change.  HENT:  Positive for congestion, ear pain, sinus pressure, sinus pain and sore throat. Negative for dental problem, drooling, ear discharge, facial swelling, hearing loss, mouth sores, nosebleeds, postnasal drip, rhinorrhea, sneezing, tinnitus, trouble swallowing and voice change.   Respiratory:  Positive for cough. Negative for apnea, choking, chest tightness, shortness of breath, wheezing and stridor.    Gastrointestinal: Negative.   Neurological:  Positive for headaches. Negative for dizziness, tremors, seizures, syncope, facial asymmetry, speech difficulty, weakness, light-headedness and numbness.     Physical Exam Triage Vital Signs ED Triage Vitals  Encounter Vitals Group     BP 04/26/24 1321 (!) 150/82     Girls Systolic BP Percentile --      Girls Diastolic BP Percentile --      Boys Systolic BP Percentile --      Boys Diastolic BP Percentile --      Pulse Rate 04/26/24 1321 77     Resp 04/26/24 1321 20     Temp 04/26/24 1321 98.3 F (36.8 C)     Temp Source 04/26/24 1321 Oral     SpO2 04/26/24 1321 98 %     Weight --      Height --      Head Circumference --      Peak Flow --      Pain Score 04/26/24 1323 4     Pain Loc --      Pain Education --      Exclude from Growth Chart --    No data found.  Updated Vital Signs BP (!) 150/82 (BP Location: Right Arm)   Pulse 77   Temp 98.3 F (36.8 C) (Oral)   Resp 20   SpO2 98%   Visual Acuity Right Eye Distance:   Left Eye Distance:   Bilateral Distance:    Right Eye Near:   Left Eye Near:    Bilateral Near:     Physical Exam Constitutional:      Appearance: Normal appearance.  HENT:     Right Ear: Tympanic membrane, ear canal and external ear normal.     Left Ear: Tympanic membrane, ear canal and external ear normal.     Nose: Congestion present.     Left Sinus: Maxillary sinus tenderness and frontal sinus tenderness present.     Mouth/Throat:     Pharynx: Oropharyngeal exudate present. No posterior oropharyngeal erythema.  Eyes:     Extraocular Movements: Extraocular movements intact.  Cardiovascular:     Rate and Rhythm: Normal rate and regular rhythm.     Pulses: Normal pulses.     Heart sounds: Normal heart sounds.  Pulmonary:     Effort: Pulmonary effort is normal.     Breath sounds: Normal breath sounds.  Musculoskeletal:     Cervical back: Normal range of motion and neck supple.   Neurological:     Mental Status: She is alert and oriented to person, place, and time. Mental status is at baseline.      UC Treatments / Results  Labs (all labs ordered are listed, but only abnormal results are displayed) Labs Reviewed - No data to  display  EKG   Radiology No results found.  Procedures Procedures (including critical care time)  Medications Ordered in UC Medications - No data to display  Initial Impression / Assessment and Plan / UC Course  I have reviewed the triage vital signs and the nursing notes.  Pertinent labs & imaging results that were available during my care of the patient were reviewed by me and considered in my medical decision making (see chart for details).  Acute nonrecurrent pansinusitis  Patient is in no signs of distress nor toxic appearing.  Vital signs are stable.  Low suspicion for pneumonia, pneumothorax or bronchitis and therefore will defer imaging.  Viral testing deferred due to timeline of illness.  Presentation consistent with a sinus infection, present for 7 days therefore initiating antibiotics, prescribed Augmentin  prednisone  and Atrovent  nasal spray.May use additional over-the-counter medications as needed for supportive care.  May follow-up with urgent care as needed if symptoms persist or worsen.  Final Clinical Impressions(s) / UC Diagnoses   Final diagnoses:  None   Discharge Instructions   None    ED Prescriptions   None    PDMP not reviewed this encounter.     [1]  Social History Tobacco Use   Smoking status: Never   Smokeless tobacco: Never  Vaping Use   Vaping status: Never Used  Substance Use Topics   Alcohol use: No   Drug use: No     Teresa Shelba SAUNDERS, NP 04/26/24 1337  "
